# Patient Record
Sex: Female | Born: 1970 | Hispanic: No | Smoking: Never smoker
Health system: Southern US, Community
[De-identification: ages and names within clinical notes are randomized; demographics above are authoritative.]

## PROBLEM LIST (undated history)

## (undated) ENCOUNTER — Inpatient Hospital Stay (HOSPITAL_COMMUNITY): Payer: Self-pay

## (undated) DIAGNOSIS — O039 Complete or unspecified spontaneous abortion without complication: Secondary | ICD-10-CM

## (undated) HISTORY — DX: Complete or unspecified spontaneous abortion without complication: O03.9

## (undated) HISTORY — PX: NO PAST SURGERIES: SHX2092

---

## 1999-03-21 ENCOUNTER — Inpatient Hospital Stay (HOSPITAL_COMMUNITY): Admission: AD | Admit: 1999-03-21 | Discharge: 1999-03-23 | Payer: Self-pay | Admitting: Obstetrics

## 2001-04-17 ENCOUNTER — Ambulatory Visit (HOSPITAL_COMMUNITY): Admission: RE | Admit: 2001-04-17 | Discharge: 2001-04-17 | Payer: Self-pay | Admitting: Family Medicine

## 2001-04-17 ENCOUNTER — Encounter: Payer: Self-pay | Admitting: Family Medicine

## 2002-12-31 ENCOUNTER — Ambulatory Visit (HOSPITAL_COMMUNITY): Admission: RE | Admit: 2002-12-31 | Discharge: 2002-12-31 | Payer: Self-pay | Admitting: Family Medicine

## 2003-10-25 ENCOUNTER — Ambulatory Visit: Payer: Self-pay | Admitting: Family Medicine

## 2003-11-25 ENCOUNTER — Ambulatory Visit: Payer: Self-pay | Admitting: Family Medicine

## 2004-03-06 ENCOUNTER — Ambulatory Visit: Payer: Self-pay | Admitting: Family Medicine

## 2004-03-18 ENCOUNTER — Ambulatory Visit: Payer: Self-pay | Admitting: Cardiology

## 2004-03-18 ENCOUNTER — Encounter: Payer: Self-pay | Admitting: Cardiology

## 2004-03-18 ENCOUNTER — Ambulatory Visit (HOSPITAL_COMMUNITY): Admission: RE | Admit: 2004-03-18 | Discharge: 2004-03-18 | Payer: Self-pay | Admitting: Family Medicine

## 2004-07-03 ENCOUNTER — Ambulatory Visit: Payer: Self-pay | Admitting: Family Medicine

## 2004-07-06 ENCOUNTER — Ambulatory Visit: Payer: Self-pay | Admitting: *Deleted

## 2004-08-04 ENCOUNTER — Ambulatory Visit: Payer: Self-pay | Admitting: Family Medicine

## 2005-05-07 ENCOUNTER — Ambulatory Visit: Payer: Self-pay | Admitting: Family Medicine

## 2005-11-23 ENCOUNTER — Ambulatory Visit: Payer: Self-pay | Admitting: Family Medicine

## 2005-11-23 ENCOUNTER — Encounter (INDEPENDENT_AMBULATORY_CARE_PROVIDER_SITE_OTHER): Payer: Self-pay | Admitting: Family Medicine

## 2005-11-29 ENCOUNTER — Ambulatory Visit (HOSPITAL_COMMUNITY): Admission: RE | Admit: 2005-11-29 | Discharge: 2005-11-29 | Payer: Self-pay | Admitting: Family Medicine

## 2006-11-02 ENCOUNTER — Encounter (INDEPENDENT_AMBULATORY_CARE_PROVIDER_SITE_OTHER): Payer: Self-pay | Admitting: *Deleted

## 2007-07-20 ENCOUNTER — Ambulatory Visit: Payer: Self-pay | Admitting: Internal Medicine

## 2007-07-20 ENCOUNTER — Encounter (INDEPENDENT_AMBULATORY_CARE_PROVIDER_SITE_OTHER): Payer: Self-pay | Admitting: Family Medicine

## 2007-07-20 LAB — CONVERTED CEMR LAB
AST: 12 units/L (ref 0–37)
Alkaline Phosphatase: 47 units/L (ref 39–117)
BUN: 11 mg/dL (ref 6–23)
Basophils Absolute: 0.1 10*3/uL (ref 0.0–0.1)
Basophils Relative: 1 % (ref 0–1)
Calcium: 8.9 mg/dL (ref 8.4–10.5)
Chloride: 106 meq/L (ref 96–112)
Creatinine, Ser: 0.61 mg/dL (ref 0.40–1.20)
Eosinophils Relative: 2 % (ref 0–5)
Glucose, Bld: 67 mg/dL — ABNORMAL LOW (ref 70–99)
HCT: 45 % (ref 36.0–46.0)
Hemoglobin: 14.6 g/dL (ref 12.0–15.0)
MCHC: 32.4 g/dL (ref 30.0–36.0)
Monocytes Absolute: 0.4 10*3/uL (ref 0.1–1.0)
Monocytes Relative: 9 % (ref 3–12)
RDW: 13 % (ref 11.5–15.5)

## 2007-08-14 ENCOUNTER — Ambulatory Visit (HOSPITAL_COMMUNITY): Admission: RE | Admit: 2007-08-14 | Discharge: 2007-08-14 | Payer: Self-pay | Admitting: Family Medicine

## 2007-10-03 ENCOUNTER — Ambulatory Visit: Payer: Self-pay | Admitting: Internal Medicine

## 2007-10-03 ENCOUNTER — Encounter (INDEPENDENT_AMBULATORY_CARE_PROVIDER_SITE_OTHER): Payer: Self-pay | Admitting: *Deleted

## 2007-12-11 ENCOUNTER — Emergency Department (HOSPITAL_COMMUNITY): Admission: EM | Admit: 2007-12-11 | Discharge: 2007-12-11 | Payer: Self-pay | Admitting: Family Medicine

## 2008-01-04 ENCOUNTER — Ambulatory Visit: Payer: Self-pay | Admitting: Internal Medicine

## 2008-01-05 ENCOUNTER — Encounter (INDEPENDENT_AMBULATORY_CARE_PROVIDER_SITE_OTHER): Payer: Self-pay | Admitting: Internal Medicine

## 2008-01-05 LAB — CONVERTED CEMR LAB
Bilirubin Urine: NEGATIVE
Protein, ur: 30 mg/dL — AB
Specific Gravity, Urine: 1.015 (ref 1.005–1.03)
Urobilinogen, UA: 0.2 (ref 0.0–1.0)

## 2009-01-21 ENCOUNTER — Ambulatory Visit: Payer: Self-pay | Admitting: Family Medicine

## 2009-01-21 LAB — CONVERTED CEMR LAB: Chlamydia, DNA Probe: NEGATIVE

## 2010-03-07 ENCOUNTER — Encounter: Payer: Self-pay | Admitting: Family Medicine

## 2010-04-10 ENCOUNTER — Other Ambulatory Visit: Payer: Self-pay | Admitting: Family Medicine

## 2010-04-10 ENCOUNTER — Encounter (INDEPENDENT_AMBULATORY_CARE_PROVIDER_SITE_OTHER): Payer: Self-pay | Admitting: Family Medicine

## 2010-04-10 LAB — CONVERTED CEMR LAB: GC Probe Amp, Genital: NEGATIVE

## 2010-05-13 ENCOUNTER — Other Ambulatory Visit: Payer: Self-pay | Admitting: Family Medicine

## 2010-05-13 ENCOUNTER — Encounter: Payer: Self-pay | Admitting: Family Medicine

## 2010-05-13 DIAGNOSIS — N72 Inflammatory disease of cervix uteri: Secondary | ICD-10-CM

## 2010-05-13 DIAGNOSIS — R87810 Cervical high risk human papillomavirus (HPV) DNA test positive: Secondary | ICD-10-CM

## 2010-05-14 LAB — POCT PREGNANCY, URINE: Preg Test, Ur: NEGATIVE

## 2012-08-17 ENCOUNTER — Emergency Department (INDEPENDENT_AMBULATORY_CARE_PROVIDER_SITE_OTHER): Payer: Self-pay

## 2012-08-17 ENCOUNTER — Emergency Department (INDEPENDENT_AMBULATORY_CARE_PROVIDER_SITE_OTHER)
Admission: EM | Admit: 2012-08-17 | Discharge: 2012-08-17 | Disposition: A | Discharge status: Home / Self Care | Payer: Self-pay | Source: Home / Self Care

## 2012-08-17 ENCOUNTER — Encounter (HOSPITAL_COMMUNITY): Payer: Self-pay | Admitting: Emergency Medicine

## 2012-08-17 DIAGNOSIS — S139XXA Sprain of joints and ligaments of unspecified parts of neck, initial encounter: Secondary | ICD-10-CM

## 2012-08-17 DIAGNOSIS — S335XXA Sprain of ligaments of lumbar spine, initial encounter: Secondary | ICD-10-CM

## 2012-08-17 DIAGNOSIS — S161XXA Strain of muscle, fascia and tendon at neck level, initial encounter: Secondary | ICD-10-CM

## 2012-08-17 DIAGNOSIS — S39012A Strain of muscle, fascia and tendon of lower back, initial encounter: Secondary | ICD-10-CM

## 2012-08-17 MED ORDER — HYDROCODONE-ACETAMINOPHEN 5-325 MG PO TABS: 1.0000 | ORAL_TABLET | Freq: Three times a day (TID) | ORAL | Status: DC | PRN

## 2012-08-17 MED ORDER — METHYLPREDNISOLONE 4 MG PO KIT: PACK | ORAL | Status: DC

## 2012-08-17 MED ORDER — METHOCARBAMOL 500 MG PO TABS: 500.0000 mg | ORAL_TABLET | Freq: Three times a day (TID) | ORAL | Status: DC | PRN
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
## 2012-08-19 NOTE — ED Provider Notes (Signed)
Medical screening examination/treatment/procedure(s) were performed by non-physician practitioner and as supervising physician I was immediately available for consultation/collaboration.   [facility]; MD  Sharin Grave, MD 08/19/12 [phone]

## 2013-06-08 ENCOUNTER — Encounter: Payer: Self-pay | Admitting: Internal Medicine

## 2013-06-08 ENCOUNTER — Ambulatory Visit: Payer: No Typology Code available for payment source | Attending: Internal Medicine | Admitting: Internal Medicine

## 2013-06-08 VITALS — BP 117/77 | HR 66 | Temp 98.5°F | Resp 16 | Wt 119.8 lb

## 2013-06-08 DIAGNOSIS — R1012 Left upper quadrant pain: Secondary | ICD-10-CM | POA: Insufficient documentation

## 2013-06-08 DIAGNOSIS — IMO0001 Reserved for inherently not codable concepts without codable children: Secondary | ICD-10-CM | POA: Insufficient documentation

## 2013-06-08 DIAGNOSIS — Z139 Encounter for screening, unspecified: Secondary | ICD-10-CM

## 2013-06-08 DIAGNOSIS — M791 Myalgia, unspecified site: Secondary | ICD-10-CM

## 2013-06-08 DIAGNOSIS — R350 Frequency of micturition: Secondary | ICD-10-CM

## 2013-06-08 LAB — POCT URINALYSIS DIPSTICK
Bilirubin, UA: NEGATIVE
Glucose, UA: NEGATIVE
KETONES UA: NEGATIVE
Leukocytes, UA: NEGATIVE
Nitrite, UA: NEGATIVE
PH UA: 7
PROTEIN UA: NEGATIVE
SPEC GRAV UA: 1.01
Urobilinogen, UA: 0.2

## 2013-06-08 LAB — CBC WITH DIFFERENTIAL/PLATELET
BASOS ABS: 0 10*3/uL (ref 0.0–0.1)
BASOS PCT: 1 % (ref 0–1)
EOS PCT: 2 % (ref 0–5)
Eosinophils Absolute: 0.1 10*3/uL (ref 0.0–0.7)
HEMATOCRIT: 39.6 % (ref 36.0–46.0)
Hemoglobin: 13.9 g/dL (ref 12.0–15.0)
LYMPHS PCT: 30 % (ref 12–46)
Lymphs Abs: 1.3 10*3/uL (ref 0.7–4.0)
MCH: 31.9 pg (ref 26.0–34.0)
MCHC: 35.1 g/dL (ref 30.0–36.0)
MCV: 90.8 fL (ref 78.0–100.0)
MONO ABS: 0.3 10*3/uL (ref 0.1–1.0)
Monocytes Relative: 8 % (ref 3–12)
Neutro Abs: 2.5 10*3/uL (ref 1.7–7.7)
Neutrophils Relative %: 59 % (ref 43–77)
Platelets: 265 10*3/uL (ref 150–400)
RBC: 4.36 MIL/uL (ref 3.87–5.11)
RDW: 13.4 % (ref 11.5–15.5)
WBC: 4.2 10*3/uL (ref 4.0–10.5)

## 2013-06-08 LAB — COMPLETE METABOLIC PANEL WITH GFR
ALT: 9 U/L (ref 0–35)
AST: 12 U/L (ref 0–37)
Albumin: 4.3 g/dL (ref 3.5–5.2)
Alkaline Phosphatase: 53 U/L (ref 39–117)
BUN: 12 mg/dL (ref 6–23)
CALCIUM: 9.3 mg/dL (ref 8.4–10.5)
CHLORIDE: 105 meq/L (ref 96–112)
CO2: 26 mEq/L (ref 19–32)
CREATININE: 0.57 mg/dL (ref 0.50–1.10)
GFR, Est African American: >89 mL/min
GFR, Est Non African American: >89 mL/min
Glucose, Bld: 79 mg/dL (ref 70–99)
Potassium: 3.9 mEq/L (ref 3.5–5.3)
Sodium: 140 mEq/L (ref 135–145)
Total Bilirubin: 0.5 mg/dL (ref 0.2–1.2)
Total Protein: 6.9 g/dL (ref 6.0–8.3)

## 2013-06-08 LAB — LIPID PANEL
CHOL/HDL RATIO: 2.6 ratio
CHOLESTEROL: 180 mg/dL (ref 0–200)
HDL: 70 mg/dL (ref >39)
LDL Cholesterol: 97 mg/dL (ref 0–99)
Triglycerides: 63 mg/dL (ref <150)
VLDL: 13 mg/dL (ref 0–40)

## 2013-06-08 NOTE — Progress Notes (Signed)
Patient Demographics  Suzanne Rivas, is a 43 y.o. female  ZOX:[id number]:[id number]  [id number]:[id number]  DOB - 04/10/1970  CC:  Chief Complaint  Patient presents with  . Establish Care       HPI: Suzanne Rivas is a 43 y.o. female here today to establish medical care. Patient reported to have chronic upper abdominal left-sided pain which is sharp and last for a few hours, patient denies any relationship with food denies any change in bowel habit, she reported to have lot of urinary frequency denies any dysuria denies any nausea vomiting, she had a Pap smear done last year and is currently having menstrual periods. Patient reported myalgia and feeling fatigued  when she has menstrual periods, denies any menorrhagia. Patient has No headache, No chest pain, No abdominal pain - No Nausea, No new weakness tingling or numbness, No Cough - SOB.  No Known Allergies History reviewed. No pertinent past medical history. Current Outpatient Prescriptions on File Prior to Visit  Medication Sig Dispense Refill  . HYDROcodone-acetaminophen (NORCO/VICODIN) 5-325 MG per tablet Take 1-2 tablets by mouth every 8 (eight) hours as needed for pain.  30 tablet  0  . methocarbamol (ROBAXIN) 500 MG tablet Take 1 tablet (500 mg total) by mouth every 8 (eight) hours as needed (spasms).  30 tablet  0  . methylPREDNISolone (MEDROL DOSEPAK) 4 MG tablet 6 day dose pack.  Take as directed.  21 tablet  0   No current facility-administered medications on file prior to visit.   Family History  Problem Relation Age of Onset  . Cancer Maternal Grandmother    History   Social History  . Marital Status: Single    Spouse Name: N/A    Number of Children: N/A  . Years of Education: N/A   Occupational History  . Not on file.   Social History Main Topics  . Smoking status: Never Smoker   . Smokeless tobacco: Not on file  . Alcohol Use: No     Comment: occasinally   . Drug Use: No  . Sexual Activity: Not on  file   Other Topics Concern  . Not on file   Social History Narrative  . No narrative on file    Review of Systems: Constitutional: Negative for fever, chills, diaphoresis, activity change, appetite change and fatigue. HENT: Negative for ear pain, nosebleeds, congestion, facial swelling, rhinorrhea, neck pain, neck stiffness and ear discharge.  Eyes: Negative for pain, discharge, redness, itching and visual disturbance. Respiratory: Negative for cough, choking, chest tightness, shortness of breath, wheezing and stridor.  Cardiovascular: Negative for chest pain, palpitations and leg swelling. Gastrointestinal: Negative for abdominal distention. Genitourinary: Negative for dysuria, urgency, frequency, hematuria, flank pain, decreased urine volume, difficulty urinating and dyspareunia.  Musculoskeletal: Negative for back pain, joint swelling, arthralgia and gait problem. Neurological: Negative for dizziness, tremors, seizures, syncope, facial asymmetry, speech difficulty, weakness, light-headedness, numbness and headaches.  Hematological: Negative for adenopathy. Does not bruise/bleed easily. Psychiatric/Behavioral: Negative for hallucinations, behavioral problems, confusion, dysphoric mood, decreased concentration and agitation.    Objective:   Filed Vitals:   06/08/13 0914  BP: 117/77  Pulse: 66  Temp: 98.5 F (36.9 C)  Resp: 16    Physical Exam: Constitutional: Patient appears well-developed and well-nourished. No distress. HENT: Normocephalic, atraumatic, External right and left ear normal. Oropharynx is clear and moist.  Eyes: Conjunctivae and EOM are normal. PERRLA, no scleral icterus. Neck: Normal ROM. Neck supple. No JVD. No tracheal deviation. No  thyromegaly. CVS: RRR, S1/S2 +, no murmurs, no gallops, no carotid bruit.  Pulmonary: Effort and breath sounds normal, no stridor, rhonchi, wheezes, rales.  Abdominal: Soft. BS +, no distension, tenderness, rebound or guarding.  No CVA tenderness. Musculoskeletal: Normal range of motion. No edema and no tenderness. SLR test negative  Neuro: Alert. Normal reflexes, muscle tone coordination. No cranial nerve deficit. Skin: Skin is warm and dry. No rash noted. Not diaphoretic. No erythema. No pallor. Psychiatric: Normal mood and affect. Behavior, judgment, thought content normal.  Lab Results  Component Value Date   WBC 4.9 07/20/2007   HGB 14.6 07/20/2007   HCT 45.0 07/20/2007   MCV 100.0 07/20/2007   PLT 215 07/20/2007   Lab Results  Component Value Date   CREATININE 0.61 07/20/2007   BUN 11 07/20/2007   NA 140 07/20/2007   K 3.9 07/20/2007   CL 106 07/20/2007   CO2 23 07/20/2007    No results found for this basename: HGBA1C   Lipid Panel  No results found for this basename: chol, trig, hdl, cholhdl, vldl, ldlcalc       Assessment and plan:   1. Frequent urination  - Urinalysis Dipstick Results for orders placed in visit on 06/08/13  POCT URINALYSIS DIPSTICK      Result Value Ref Range   Color, UA yellow     Clarity, UA clear     Glucose, UA neg     Bilirubin, UA neg     Ketones, UA neg     Spec Grav, UA 1.010     Blood, UA large     pH, UA 7.0     Protein, UA neg     Urobilinogen, UA 0.2     Nitrite, UA neg     Leukocytes, UA Negative     Urine negative for infection, blood most likely menstrual periods.   2. Abdominal pain, left upper quadrant  - US Abdomen Complete; Future  3. Myalgia  - CBC with Differential - TSH  4. Screening  ordered baseline blood work. - CBC with Differential - COMPLETE METABOLIC PANEL WITH GFR - Lipid panel - Vit D  25 hydroxy (rtn osteoporosis monitoring) - MM DIGITAL SCREENING BILATERAL; Future - Ambulatory referral to Gynecology  Health Maintenance  -Pap Smear: referred to GYN -Mammogram:ordered    Return in about 3 months (around 09/07/2013) for abdominal pain  .   Doris Cheadleeepak Remy Dia, MD

## 2013-06-08 NOTE — Progress Notes (Signed)
Patient here with interpreter Here to establish care Takes no prescribed medication Complains of frequent urination and upper abd pain

## 2013-06-09 LAB — TSH: TSH: 2.09 u[IU]/mL (ref 0.350–4.500)

## 2013-06-09 LAB — VITAMIN D 25 HYDROXY (VIT D DEFICIENCY, FRACTURES): Vit D, 25-Hydroxy: 23 ng/mL — ABNORMAL LOW (ref 30–89)

## 2013-06-18 ENCOUNTER — Telehealth: Payer: Self-pay

## 2013-06-18 MED ORDER — VITAMIN D (ERGOCALCIFEROL) 1.25 MG (50000 UNIT) PO CAPS: 50000.0000 [IU] | ORAL_CAPSULE | ORAL | Status: DC

## 2013-06-18 NOTE — Telephone Encounter (Signed)
Interpreter line used Patient is aware of her lab results Prescription sent to [facility]

## 2013-06-18 NOTE — Telephone Encounter (Signed)
Message copied by Lestine MountJUAREZ, Carney Saxton L on Mon Jun 18, 2013  4:40 PM ------      Message from: Doris CheadleADVANI, DEEPAK      Created: Mon Jun 18, 2013 12:03 PM       Blood work reviewed, noticed low vitamin D, call patient advise to start ergocalciferol 50,000 units once a week for the duration of  12 weeks.       ------

## 2013-06-28 ENCOUNTER — Other Ambulatory Visit (HOSPITAL_COMMUNITY)
Admission: RE | Admit: 2013-06-28 | Discharge: 2013-06-28 | Disposition: A | Discharge status: Home / Self Care | Payer: No Typology Code available for payment source | Source: Ambulatory Visit | Attending: Family Medicine | Admitting: Family Medicine

## 2013-06-28 ENCOUNTER — Ambulatory Visit (INDEPENDENT_AMBULATORY_CARE_PROVIDER_SITE_OTHER): Payer: No Typology Code available for payment source | Admitting: Family Medicine

## 2013-06-28 VITALS — BP 121/79 | HR 69 | Temp 98.0°F | Wt 118.5 lb

## 2013-06-28 DIAGNOSIS — Z1151 Encounter for screening for human papillomavirus (HPV): Secondary | ICD-10-CM | POA: Insufficient documentation

## 2013-06-28 DIAGNOSIS — Z124 Encounter for screening for malignant neoplasm of cervix: Secondary | ICD-10-CM

## 2013-06-28 DIAGNOSIS — Z01419 Encounter for gynecological examination (general) (routine) without abnormal findings: Secondary | ICD-10-CM | POA: Insufficient documentation

## 2013-07-03 NOTE — Progress Notes (Signed)
Patient ID: Suzanne Rivas, female   DOB: 11/13/1970, 43 y.o.   MRN: [id number] Visit for pap smear/ well woman GYN.  Patient   is   having periods They are   regular She   is  Currently sexually active with men Contraceptive needs: None Pelvic issues such as pain, discharge, abnormal bleeding? No Smoker?   Never smoker Any pertinent FH of cervical or uterine cancer, breat cancer? No Any particular pertinent personal medical history?  no   OBJECTIVE: Well Developed female no acute distress GU: Externally normal. No adnexal masses or tenderness. Uterus is normal in position. No vaginal discharge. Pap is obtained.

## 2013-07-05 ENCOUNTER — Ambulatory Visit (HOSPITAL_COMMUNITY)
Admission: RE | Admit: 2013-07-05 | Discharge: 2013-07-05 | Disposition: A | Discharge status: Home / Self Care | Payer: No Typology Code available for payment source | Source: Ambulatory Visit | Attending: Internal Medicine | Admitting: Internal Medicine

## 2013-07-05 ENCOUNTER — Other Ambulatory Visit: Payer: Self-pay | Admitting: Internal Medicine

## 2013-07-05 DIAGNOSIS — Z139 Encounter for screening, unspecified: Secondary | ICD-10-CM

## 2013-07-05 DIAGNOSIS — Z1231 Encounter for screening mammogram for malignant neoplasm of breast: Secondary | ICD-10-CM

## 2013-07-10 ENCOUNTER — Ambulatory Visit (HOSPITAL_COMMUNITY): Admission: RE | Admit: 2013-07-10 | Payer: No Typology Code available for payment source | Source: Ambulatory Visit

## 2013-07-17 ENCOUNTER — Encounter: Payer: Self-pay | Admitting: Family Medicine

## 2013-09-07 ENCOUNTER — Encounter: Payer: Self-pay | Admitting: Internal Medicine

## 2013-09-07 ENCOUNTER — Ambulatory Visit: Payer: Self-pay | Attending: Internal Medicine | Admitting: Internal Medicine

## 2013-09-07 VITALS — BP 123/72 | HR 61 | Temp 98.5°F | Resp 16 | Wt 118.6 lb

## 2013-09-07 DIAGNOSIS — Z79899 Other long term (current) drug therapy: Secondary | ICD-10-CM | POA: Insufficient documentation

## 2013-09-07 DIAGNOSIS — E559 Vitamin D deficiency, unspecified: Secondary | ICD-10-CM | POA: Insufficient documentation

## 2013-09-07 DIAGNOSIS — R1013 Epigastric pain: Secondary | ICD-10-CM

## 2013-09-07 DIAGNOSIS — R1012 Left upper quadrant pain: Secondary | ICD-10-CM | POA: Insufficient documentation

## 2013-09-07 LAB — POCT URINALYSIS DIPSTICK
BILIRUBIN UA: NEGATIVE
Blood, UA: NEGATIVE
GLUCOSE UA: NEGATIVE
KETONES UA: NEGATIVE
LEUKOCYTES UA: NEGATIVE
Nitrite, UA: NEGATIVE
PH UA: 7
Protein, UA: NEGATIVE
Spec Grav, UA: 1.02
Urobilinogen, UA: 1

## 2013-09-07 LAB — POCT URINE PREGNANCY: PREG TEST UR: NEGATIVE

## 2013-09-07 NOTE — Progress Notes (Signed)
Patient here for follow up on her abd pain States still having pain in her upper quadrant on th left side

## 2013-09-07 NOTE — Progress Notes (Signed)
MRN: [id number] Name: Suzanne Rivas  Sex: female Age: 43 y.o. DOB: 06/04/1970  Allergies: Review of patient's allergies indicates no known allergies.  Chief Complaint  Patient presents with  . Abdominal Pain    HPI: Patient is 43 y.o. female who comes today for followup, still complaining of upper abdominal pain especially on the left upper quadrant denies any nausea vomiting change in bowel habits denies any urinary symptoms denies any relationship with food, patient had a blood work done which was reviewed with the patient noticed vitamin D deficiency which she already completed a course of prescription dose. Ultrasound was ordered on the last visit but patient has not been able to get done.  History reviewed. No pertinent past medical history.  History reviewed. No pertinent past surgical history.    Medication List       This list is accurate as of: 09/07/13  5:26 PM.  Always use your most recent med list.               HYDROcodone-acetaminophen 5-325 MG per tablet  Commonly known as:  NORCO/VICODIN  Take 1-2 tablets by mouth every 8 (eight) hours as needed for pain.     methocarbamol 500 MG tablet  Commonly known as:  ROBAXIN  Take 1 tablet (500 mg total) by mouth every 8 (eight) hours as needed (spasms).     methylPREDNISolone 4 MG tablet  Commonly known as:  MEDROL DOSEPAK  6 day dose pack.  Take as directed.     Vitamin D (Ergocalciferol) 50000 UNITS Caps capsule  Commonly known as:  DRISDOL  Take 1 capsule (50,000 Units total) by mouth every 7 (seven) days.        No orders of the defined types were placed in this encounter.     There is no immunization history on file for this patient.  Family History  Problem Relation Age of Onset  . Cancer Maternal Grandmother     History  Substance Use Topics  . Smoking status: Never Smoker   . Smokeless tobacco: Not on file  . Alcohol Use: No     Comment: occasinally     Review of  Systems   As noted in HPI  Filed Vitals:   09/07/13 1705  BP: 123/72  Pulse: 61  Temp: 98.5 F (36.9 C)  Resp: 16    Physical Exam  Physical Exam  Constitutional: No distress.  Eyes: EOM are normal. Pupils are equal, round, and reactive to light.  Cardiovascular: Normal rate and regular rhythm.   Pulmonary/Chest: Breath sounds normal. No respiratory distress. She has no wheezes. She has no rales.  Abdominal: Bowel sounds are normal. There is no rebound and no guarding.  Minimal discomfort with deep palpation in LUQ   Musculoskeletal: She exhibits no edema.    CBC    Component Value Date/Time   WBC 4.2 06/08/2013 0947   RBC 4.36 06/08/2013 0947   HGB 13.9 06/08/2013 0947   HCT 39.6 06/08/2013 0947   PLT 265 06/08/2013 0947   MCV 90.8 06/08/2013 0947   LYMPHSABS 1.3 06/08/2013 0947   MONOABS 0.3 06/08/2013 0947   EOSABS 0.1 06/08/2013 0947   BASOSABS 0.0 06/08/2013 0947    CMP     Component Value Date/Time   NA 140 06/08/2013 0947   K 3.9 06/08/2013 0947   CL 105 06/08/2013 0947   CO2 26 06/08/2013 0947   GLUCOSE 79 06/08/2013 0947   BUN 12 06/08/2013 0947  CREATININE 0.57 06/08/2013 0947   CREATININE 0.61 07/20/2007 2322   CALCIUM 9.3 06/08/2013 0947   PROT 6.9 06/08/2013 0947   ALBUMIN 4.3 06/08/2013 0947   AST 12 06/08/2013 0947   ALT 9 06/08/2013 0947   ALKPHOS 53 06/08/2013 0947   BILITOT 0.5 06/08/2013 0947   GFRNONAA >89 06/08/2013 0947   GFRAA >89 06/08/2013 0947    Lab Results  Component Value Date/Time   CHOL 180 06/08/2013  9:47 AM    No components found with this basename: hga1c    Lab Results  Component Value Date/Time   AST 12 06/08/2013  9:47 AM    Assessment and Plan  Abdominal pain, epigastric - Plan:  Results for orders placed in visit on 09/07/13  POCT URINALYSIS DIPSTICK      Result Value Ref Range   Color, UA yellow     Clarity, UA cloudy     Glucose, UA neg     Bilirubin, UA neg     Ketones, UA neg     Spec Grav, UA 1.020     Blood, UA  neg     pH, UA 7.0     Protein, UA neg     Urobilinogen, UA 1.0     Nitrite, UA neg     Leukocytes, UA Negative    POCT URINE PREGNANCY      Result Value Ref Range   Preg Test, Ur Negative     Urine test is negative for infection.    I have ordered her US Abdomen Complete for further evaluation.  Unspecified vitamin D deficiency Patient will take over-the-counter vitamin D 2000 units daily.  Health Maintenance  -Pap Smear: uptodate  -Mammogram: uptodate   Return in about 6 months (around 03/10/2014), or if symptoms worsen or fail to improve, for abdominal pain .  Doris Cheadle, MD

## 2013-09-10 ENCOUNTER — Telehealth: Payer: Self-pay

## 2013-09-10 NOTE — Telephone Encounter (Signed)
Called patient to let her know her US is scheduled for this wed 09/12/13 At 9 pm

## 2013-09-12 ENCOUNTER — Ambulatory Visit (HOSPITAL_COMMUNITY): Payer: Self-pay

## 2013-09-18 ENCOUNTER — Telehealth: Payer: Self-pay | Admitting: Internal Medicine

## 2013-09-18 ENCOUNTER — Ambulatory Visit (HOSPITAL_COMMUNITY)
Admission: RE | Admit: 2013-09-18 | Discharge: 2013-09-18 | Disposition: A | Discharge status: Home / Self Care | Payer: Self-pay | Source: Ambulatory Visit | Attending: Internal Medicine | Admitting: Internal Medicine

## 2013-09-18 DIAGNOSIS — R1013 Epigastric pain: Secondary | ICD-10-CM | POA: Insufficient documentation

## 2013-09-18 NOTE — Telephone Encounter (Signed)
Pt needs prescription for methylPREDNISolone 4 mg. [facility] [address] Please f/u with Pt

## 2013-10-01 ENCOUNTER — Telehealth: Payer: Self-pay

## 2013-10-01 NOTE — Telephone Encounter (Signed)
Interpreter line used Patient  Not available Message left to return our call

## 2013-10-01 NOTE — Telephone Encounter (Signed)
Message copied by Lestine MountJUAREZ, Hadley Soileau L on Mon Oct 01, 2013  2:04 PM ------      Message from: Doris CheadleADVANI, DEEPAK      Created: Tue Sep 18, 2013  3:46 PM       Call and let the patient know that her abdominal ultrasound is negative for gallstones. ------

## 2013-10-08 ENCOUNTER — Telehealth: Payer: Self-pay | Admitting: Emergency Medicine

## 2013-10-08 NOTE — Telephone Encounter (Signed)
Left message on voicemail for pt to call clinic for ultrasound results per Spanish intepretor, Orion

## 2013-10-08 NOTE — Telephone Encounter (Signed)
Pt. Calling to get results of ultrasound. Please f/u with pt.

## 2013-10-09 ENCOUNTER — Telehealth: Payer: Self-pay | Admitting: *Deleted

## 2013-10-09 NOTE — Telephone Encounter (Signed)
Pt is aware of her US results.  

## 2013-10-09 NOTE — Telephone Encounter (Signed)
Message copied by Raynelle Chary on Tue Oct 09, 2013  4:12 PM ------      Message from: Doris Cheadle      Created: Tue Sep 18, 2013  3:46 PM       Call and let the patient know that her abdominal ultrasound is negative for gallstones. ------

## 2014-11-20 ENCOUNTER — Ambulatory Visit: Payer: Self-pay | Admitting: Internal Medicine

## 2014-12-06 ENCOUNTER — Ambulatory Visit: Payer: Self-pay | Admitting: Internal Medicine

## 2014-12-24 ENCOUNTER — Ambulatory Visit (INDEPENDENT_AMBULATORY_CARE_PROVIDER_SITE_OTHER): Payer: Self-pay | Admitting: Internal Medicine

## 2014-12-24 ENCOUNTER — Encounter: Payer: Self-pay | Admitting: Internal Medicine

## 2014-12-24 VITALS — BP 116/74 | HR 66 | Ht 63.0 in | Wt 118.0 lb

## 2014-12-24 DIAGNOSIS — M722 Plantar fascial fibromatosis: Secondary | ICD-10-CM

## 2014-12-24 MED ORDER — IBUPROFEN 600 MG PO TABS: ORAL_TABLET | ORAL | Status: DC

## 2014-12-24 NOTE — Patient Instructions (Signed)
Stretches of foot before getting out of bed and in the evening every day Stretches with frozen water bottle when sitting during day Heel cups for both shoes--make sure you use them in all shoes No barefoot time--always wear cushioned shoes Ibuprofen twice daily for at least 2 weeks.  Always take with food.

## 2014-12-24 NOTE — Progress Notes (Signed)
   Subjective:    Patient ID: Suzanne Rivas, female    DOB: 04/01/1970, 44 y.o.   MRN: [id number]  HPI  Right heel pain for 4-5 months.  Cannot recall an injury prior to pain starting up.  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  On hard floors all day.  Wears slip resistant shoes.  States the shoes are cushioned.  Never barefoot.  Has carpeting at home.  Hurts constantly.  Maybe hurts a bit more after having not been on it for a while.  Not clear if first thing in morning it is particularly bad.   Takes Tylenol 500 mg tab with some improvement.  Has tried ice and massage to the area as well.  Icy Hot has been tried as well.  None of these things seems to help.    Review of Systems     Objective:   Physical Exam  Right Foot:  Good cap refill.  Normodynamic DP and PT pulses. Full ROM--some tenderness with dorsiflexion.   No erythema or swelling.  Significant point tenderness over plantar aspect of calcaneus.  Mild tenderness in medial arch.      Assessment & Plan:  Right Plantar Fasciitis:  Heel cups, stretches, Ibuprofen.  Avoid going bare or stocking footed. Follow up in 2 months if no improvement.

## 2015-05-17 DIAGNOSIS — O039 Complete or unspecified spontaneous abortion without complication: Secondary | ICD-10-CM

## 2015-05-17 HISTORY — DX: Complete or unspecified spontaneous abortion without complication: O03.9

## 2015-06-20 ENCOUNTER — Ambulatory Visit (INDEPENDENT_AMBULATORY_CARE_PROVIDER_SITE_OTHER): Payer: Self-pay | Admitting: Internal Medicine

## 2015-06-20 ENCOUNTER — Encounter: Payer: Self-pay | Admitting: Internal Medicine

## 2015-06-20 VITALS — BP 118/76 | HR 70 | Resp 16 | Ht 63.0 in | Wt 118.0 lb

## 2015-06-20 DIAGNOSIS — R102 Pelvic and perineal pain: Secondary | ICD-10-CM

## 2015-06-20 DIAGNOSIS — N926 Irregular menstruation, unspecified: Secondary | ICD-10-CM

## 2015-06-20 LAB — POCT WET PREP WITH KOH
CLUE CELLS WET PREP PER HPF POC: NEGATIVE
KOH PREP POC: NEGATIVE
RBC Wet Prep HPF POC: NEGATIVE
TRICHOMONAS UA: NEGATIVE
YEAST WET PREP PER HPF POC: NEGATIVE

## 2015-06-20 LAB — POCT URINE PREGNANCY: PREG TEST UR: NEGATIVE

## 2015-06-20 LAB — POCT URINALYSIS DIPSTICK
BILIRUBIN UA: NEGATIVE
GLUCOSE UA: NEGATIVE
Ketones, UA: 5
LEUKOCYTES UA: NEGATIVE
NITRITE UA: NEGATIVE
Protein, UA: NEGATIVE
Spec Grav, UA: 1.02
Urobilinogen, UA: NEGATIVE
pH, UA: 5

## 2015-06-20 NOTE — Progress Notes (Signed)
Subjective:    Patient ID: Suzanne Rivas, female    DOB: 11/19/1970, 45 y.o.   MRN: [id number]  HPI   1.  ProblemNoreene Larssons with period. March 18-22nd:  Had regular period April 8th and 9th:   Had just a little bloody show. May 3,4.5:  Having just a little bit of bloody show When has had the minimal menstrual flow, having a lot of pain in the mid pelvic area.  Did have some right pelvic pain about 4 days ago lasting about 3 minutes and then gone.  Hurts more there when sits down. Otherwise, no pain in between blood flow.   Is sexually active.  Not using anything to prevent pregnancy.  Has not had intercourse in 1 month as her fiance has been out of time.   Is with her fiance/boyfriend of 10 years and they have never used any protection.   No vaginal discharge. No definite breast tenderness.  No definite fatigue, no nausea. Patient has been pregnant in past with a different partner.  She has two older children, ages 6616 and 43 yo.   Current outpatient prescriptions:  Marland Kitchen.  Vitamin D, Ergocalciferol, (DRISDOL) 50000 UNITS CAPS capsule, Take 1 capsule (50,000 Units total) by mouth every 7 (seven) days., Disp: 12 capsule, Rfl: 0 .  ibuprofen (ADVIL,MOTRIN) 600 MG tablet, 1 tab by mouth twice daily with food for next 2 weeks, then every 6 hours as needed thereafter. (Patient not taking: Reported on 06/20/2015), Disp: 60 tablet, Rfl: 1   No Known Allergies   No past medical history on file.   No past surgical history on file.   Family History  Problem Relation Age of Onset  . Cancer Maternal Grandmother     cervical cancer   Social History   Social History  . Marital Status: Single    Spouse Name: N/A  . Number of Children: 2  . Years of Education: N/A   Occupational History  . Cook     [employer]   Social History Main Topics  . Smoking status: Never Smoker   . Smokeless tobacco: Never Used  . Alcohol Use: No     Comment: occasinally   . Drug Use: No  . Sexual Activity: Yes    Other Topics Concern  . Not on file   Social History Narrative   [place]   [place]   Lives at home with her 316 yo daughter.        Review of Systems     Objective:   Physical Exam NAD Lungs:  CTA CV: RRR without murmur or rub, radial pulses normal and equal Abd: S, + BS, No HSM or mass, Tender in lower abdomen, R> L GU:  No discharge, no inflammation, Uterus is anteverted with difficulty intially obtaining good view of cervix Possible small endocervical polyp vs small clot at os--unable to wipe away, but does not extend out of os into vaginal canal.  No CMT, uterus is without mass, but tender with palpation--pt. States really hurts to right adnexa with palpation of uterus Right adnexa tender and full. Left adnexa without mass or tenderness No other vaginal or cervical mucosa lesion.    Assessment & Plan:  1.  Right adnexal tenderness and possible enlargement:  Pelvic ultrasound.  If cannot get in next month, [facility] and can apply for financial assistance thereafter.    2.  ?  Endocervical Polyp?  Will reevaluate  when not menstruating.  Do not think this is related to pain, but could be bleeding from a polyp.  See what ultrasound shows.  Ibuprofen 400-800 mg every 6 hours with food for pain.

## 2015-06-20 NOTE — Patient Instructions (Signed)
Ibuprofen (Motrin, Advil)  200 mg 2-4 tabs with food every 6 hours as needed for pain

## 2015-09-02 ENCOUNTER — Encounter (INDEPENDENT_AMBULATORY_CARE_PROVIDER_SITE_OTHER): Payer: Self-pay

## 2016-03-16 ENCOUNTER — Encounter: Payer: Self-pay | Admitting: Pediatric Intensive Care

## 2016-03-25 ENCOUNTER — Encounter: Payer: Self-pay | Admitting: Pediatric Intensive Care

## 2016-03-25 NOTE — Congregational Nurse Program (Signed)
Congregational Nurse Program Note  Date of Encounter: 03/16/2016  Past Medical History: No past medical history on file.  Encounter Details:     CNP Questionnaire - 03/16/16 1500      Patient Demographics   Is this a new or existing patient? New   Patient is considered a/an Immigrant   Race Latino/Hispanic     Patient Assistance   Location of Patient Assistance [place]   Patient's financial/insurance status Self-Pay (Uninsured)   Uninsured Patient ([employer] Research officer, trade unionCard/Care Connects) Yes   Interventions Not Applicable   Patient referred to apply for the following financial assistance [employer]   Food insecurities addressed Not Technical brewerApplicable   Transportation assistance No   Assistance securing medications No   Educational health offerings Acute disease;Navigating the healthcare system     Encounter Details   Primary purpose of visit Acute Illness/Condition Visit   Was an Emergency Department visit averted? Not Applicable   Does patient have a medical provider? Yes   Patient referred to Follow up with established PCP   Was a mental health screening completed? (GAINS tool) No   Does patient have dental issues? No   Does patient have vision issues? No   Does your patient have an abnormal blood pressure today? No   Since previous encounter, have you referred patient for abnormal blood pressure that resulted in a new diagnosis or medication change? No   Does your patient have an abnormal blood glucose today? No   Since previous encounter, have you referred patient for abnormal blood glucose that resulted in a new diagnosis or medication change? No   Was there a life-saving intervention made? No     Client reports via interpreter Bridget Hartshornerla that she has had sore throat, chills and body aches starting earlier in the week. Now has developed a cough. She is taking OTC cold medication. BBS clear with intermittent coughing. Non-productive. CN advised rest, fluids and see PCP if  symptoms persist.

## 2016-05-11 ENCOUNTER — Encounter (HOSPITAL_COMMUNITY): Payer: Self-pay | Admitting: Medical

## 2016-05-11 ENCOUNTER — Inpatient Hospital Stay (HOSPITAL_COMMUNITY)
Admission: AD | Admit: 2016-05-11 | Discharge: 2016-05-11 | Disposition: A | Discharge status: Home / Self Care | Payer: Self-pay | Source: Ambulatory Visit | Attending: Family Medicine | Admitting: Family Medicine

## 2016-05-11 ENCOUNTER — Ambulatory Visit (INDEPENDENT_AMBULATORY_CARE_PROVIDER_SITE_OTHER): Payer: Self-pay | Admitting: Internal Medicine

## 2016-05-11 ENCOUNTER — Inpatient Hospital Stay (HOSPITAL_COMMUNITY): Payer: Self-pay

## 2016-05-11 ENCOUNTER — Encounter: Payer: Self-pay | Admitting: Internal Medicine

## 2016-05-11 VITALS — BP 112/70 | HR 68 | Resp 12 | Ht 63.0 in | Wt 112.0 lb

## 2016-05-11 DIAGNOSIS — Z3A01 Less than 8 weeks gestation of pregnancy: Secondary | ICD-10-CM

## 2016-05-11 DIAGNOSIS — R109 Unspecified abdominal pain: Secondary | ICD-10-CM

## 2016-05-11 DIAGNOSIS — R1012 Left upper quadrant pain: Secondary | ICD-10-CM | POA: Insufficient documentation

## 2016-05-11 DIAGNOSIS — O26891 Other specified pregnancy related conditions, first trimester: Secondary | ICD-10-CM

## 2016-05-11 DIAGNOSIS — O3680X0 Pregnancy with inconclusive fetal viability, not applicable or unspecified: Secondary | ICD-10-CM

## 2016-05-11 DIAGNOSIS — O26899 Other specified pregnancy related conditions, unspecified trimester: Secondary | ICD-10-CM

## 2016-05-11 DIAGNOSIS — R102 Pelvic and perineal pain: Secondary | ICD-10-CM

## 2016-05-11 DIAGNOSIS — O209 Hemorrhage in early pregnancy, unspecified: Secondary | ICD-10-CM

## 2016-05-11 DIAGNOSIS — Z6790 Unspecified blood type, Rh positive: Secondary | ICD-10-CM | POA: Insufficient documentation

## 2016-05-11 DIAGNOSIS — N926 Irregular menstruation, unspecified: Secondary | ICD-10-CM

## 2016-05-11 LAB — CBC WITH DIFFERENTIAL/PLATELET
BASOS PCT: 1 %
Basophils Absolute: 0.1 10*3/uL (ref 0.0–0.1)
EOS PCT: 2 %
Eosinophils Absolute: 0.1 10*3/uL (ref 0.0–0.7)
HEMATOCRIT: 39.1 % (ref 36.0–46.0)
Hemoglobin: 13.5 g/dL (ref 12.0–15.0)
Lymphocytes Relative: 21 %
Lymphs Abs: 1.5 10*3/uL (ref 0.7–4.0)
MCH: 32.9 pg (ref 26.0–34.0)
MCHC: 34.5 g/dL (ref 30.0–36.0)
MCV: 95.4 fL (ref 78.0–100.0)
MONO ABS: 0.4 10*3/uL (ref 0.1–1.0)
MONOS PCT: 6 %
NEUTROS ABS: 5 10*3/uL (ref 1.7–7.7)
Neutrophils Relative %: 70 %
Platelets: 262 10*3/uL (ref 150–400)
RBC: 4.1 MIL/uL (ref 3.87–5.11)
RDW: 13 % (ref 11.5–15.5)
WBC: 7.1 10*3/uL (ref 4.0–10.5)

## 2016-05-11 LAB — WET PREP, GENITAL
Sperm: NONE SEEN
TRICH WET PREP: NONE SEEN
Yeast Wet Prep HPF POC: NONE SEEN

## 2016-05-11 LAB — HCG, QUANTITATIVE, PREGNANCY: hCG, Beta Chain, Quant, S: 23685 m[IU]/mL — ABNORMAL HIGH (ref <5)

## 2016-05-11 LAB — POCT URINE PREGNANCY: Preg Test, Ur: POSITIVE — AB

## 2016-05-11 LAB — ABO/RH: ABO/RH(D): O POS

## 2016-05-11 NOTE — Progress Notes (Signed)
   Subjective:    Patient ID: Suzanne Rivas, female    DOB: 10/25/1970, 46 y.o.   MRN: [id number]  HPI   Here as had not had period, so checked home pregnancy test 5 days ago, which was positive.  Has had cramping discomfort in her suprapubic area for 2 weeks.  The discomfort is there most of the time.   Started with vaginal bleeding 4 days ago.  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  Today, bleeding as well, but decreased from yesterday.  Not working today.    G3P2 female.  LMP was 03/23/2016.  Has not used anything for birth control.  Has been with her partner for 13 years.  They have been trying to get pregnant but no pregnancy until now.  They have never used any form of birth control.   No history of PID or STD, no history of pelvic or abdominal surgery.  She is about [redacted] weeks gestation based on dates.   She has had no breast tenderness with this pregnancy and states she did have breast tenderness with previous pregnancies.   Her youngest child, a daughter, is 44 yo.  No outpatient prescriptions have been marked as taking for the 05/11/16 encounter (Office Visit) with Julieanne MansonElizabeth Antawn Sison, MD.    No Known Allergies        Review of Systems     Objective:   Physical Exam NAD Abd:  S, tender from umbilicus to suprapubic area.  Also mildly tender in left lower quadrant.  + BS, No HSM or mass.  Urine HCG +    Assessment & Plan:  Concern for pelvic pain, vaginal bleeding in a woman at [redacted] weeks gestation for possible tubal pregnancy. She [facility] she can be evaluated for this quickly.  I am unable to obtain an OB ultrasound quickly through the orange card.   To [facility].

## 2016-05-11 NOTE — MAU Note (Signed)
Urine in lab 

## 2016-05-11 NOTE — MAU Provider Note (Signed)
History     CSN: [id number]  Arrival date and time: 05/11/16 1716   First Provider Initiated Contact with Patient 05/11/16 1752      No chief complaint on file.  HPI  Ms. Suzanne Rivas is a 46 y.o. G1P0 at [redacted]w[redacted]d who presents to MAU today with complaint of abdominal cramping and vaginal bleeding since yesterday. The patient was seen by PCP today and had +UPT and was sent for further evaluation. She rates her pain at 5/10 at the worst. She has not taken anything for pain. She states only spotting. she has worn a panty liner, but has not soaked it. She has had some nausea without vomiting, diarrhea or constipation.     OB History    Gravida Para Term Preterm AB Living   3         2   SAB TAB Ectopic Multiple Live Births                  History reviewed. No pertinent past medical history.  History reviewed. No pertinent surgical history.  Family History  Problem Relation Age of Onset  . Cancer Maternal Grandmother     cervical cancer    Social History  Substance Use Topics  . Smoking status: Never Smoker  . Smokeless tobacco: Never Used  . Alcohol use No     Comment: occasinally     Allergies:  Allergies  Allergen Reactions  . Latex Itching and Rash    Prescriptions Prior to Admission  Medication Sig Dispense Refill Last Dose  . acetaminophen (TYLENOL) 500 MG tablet Take 500 mg by mouth every 6 (six) hours as needed for headache.   Past Week at Unknown time  . ibuprofen (ADVIL,MOTRIN) 600 MG tablet 1 tab by mouth twice daily with food for next 2 weeks, then every 6 hours as needed thereafter. (Patient not taking: Reported on 06/20/2015) 60 tablet 1 Not Taking at Unknown time    Review of Systems  Constitutional: Negative for fever.  Gastrointestinal: Positive for abdominal pain and nausea. Negative for constipation, diarrhea and vomiting.  Genitourinary: Positive for vaginal bleeding. Negative for dysuria, frequency, urgency and vaginal discharge.    Physical Exam   Blood pressure 118/73, pulse 66, temperature 98.6 F (37 C), resp. rate 16, height 5\' 3"  (1.6 m), weight 113 lb (51.3 kg), last menstrual period 03/23/2016.  Physical Exam  Nursing note and vitals reviewed. Constitutional: She is oriented to person, place, and time. She appears well-developed and well-nourished. No distress.  HENT:  Head: Normocephalic and atraumatic.  Cardiovascular: Normal rate.   Respiratory: Effort normal.  GI: Soft. She exhibits no distension and no mass. There is no tenderness. There is no rebound and no guarding.  Genitourinary: Uterus is enlarged (slightly). Uterus is not tender. Cervix exhibits no motion tenderness, no discharge and no friability. Right adnexum displays no mass and no tenderness. Left adnexum displays no mass and no tenderness. There is bleeding (scant, light) in the vagina. Vaginal discharge (scant, white) found.  Neurological: She is alert and oriented to person, place, and time.  Skin: Skin is warm and dry. No erythema.  Psychiatric: She has a normal mood and affect.  Dilation: Closed Effacement (%): Thick Cervical Position: Posterior Exam by:: Magnus Sinning, PA-C   Results for orders placed or performed during the hospital encounter of 05/11/16 (from the past 24 hour(s))  CBC with Differential/Platelet     Status: None   Collection Time: 05/11/16  6:02 PM  Result Value Ref Range   WBC 7.1 4.0 - 10.5 K/uL   RBC 4.10 3.87 - 5.11 MIL/uL   Hemoglobin 13.5 12.0 - 15.0 g/dL   HCT 16.1 09.6 - 04.5 %   MCV 95.4 78.0 - 100.0 fL   MCH 32.9 26.0 - 34.0 pg   MCHC 34.5 30.0 - 36.0 g/dL   RDW 40.9 81.1 - 91.4 %   Platelets 262 150 - 400 K/uL   Neutrophils Relative % 70 %   Neutro Abs 5.0 1.7 - 7.7 K/uL   Lymphocytes Relative 21 %   Lymphs Abs 1.5 0.7 - 4.0 K/uL   Monocytes Relative 6 %   Monocytes Absolute 0.4 0.1 - 1.0 K/uL   Eosinophils Relative 2 %   Eosinophils Absolute 0.1 0.0 - 0.7 K/uL   Basophils Relative 1 %    Basophils Absolute 0.1 0.0 - 0.1 K/uL  ABO/Rh     Status: None (Preliminary result)   Collection Time: 05/11/16  6:02 PM  Result Value Ref Range   ABO/RH(D) O POS   hCG, quantitative, pregnancy     Status: Abnormal   Collection Time: 05/11/16  6:02 PM  Result Value Ref Range   hCG, Beta Chain, Quant, S 23,685 (H) <5 mIU/mL  Wet prep, genital     Status: Abnormal   Collection Time: 05/11/16  6:12 PM  Result Value Ref Range   Yeast Wet Prep HPF POC NONE SEEN NONE SEEN   Trich, Wet Prep NONE SEEN NONE SEEN   Clue Cells Wet Prep HPF POC PRESENT (A) NONE SEEN   WBC, Wet Prep HPF POC FEW (A) NONE SEEN   Sperm NONE SEEN    No results found.  US Ob Comp Less 14 Wks  Result Date: 05/11/2016 CLINICAL DATA:  Bleeding and abdominal pain.  Cramping x2 weeks. EXAM: OBSTETRIC <14 WK [place] AND TRANSVAGINAL OB US TECHNIQUE: Both transabdominal and transvaginal ultrasound examinations were performed for complete evaluation of the gestation as well as the maternal uterus, adnexal regions, and pelvic cul-de-sac. Transvaginal technique was performed to assess early pregnancy. COMPARISON:  None. FINDINGS: Intrauterine gestational sac: None Yolk sac:  Visualized. Embryo:  Visualized. Cardiac Activity: Not Visualized. Heart Rate: Not applicable CRL:  2.6  mm   5 w   5 d                  [place] EDC: Subchorionic hemorrhage: Perigestational subchorionic hemorrhage noted bilaterally measuring up to 4 cm in maximum dimension. Maternal uterus/adnexae: Corpus luteum on the right. Unremarkable left ovary. IMPRESSION: Perigestational subchorionic hemorrhage measuring to 4 cm in maximum dimension. Single intrauterine gestation without detectable fetal cardiac activity at this time possibly due to the early stage of gestation. Follow-up ultrasound in 10-14 days is recommended. Electronically Signed   By: Tollie Eth M.D.   On: 05/11/2016 19:22   US Ob Transvaginal  Result Date: 05/11/2016 CLINICAL DATA:  Bleeding and abdominal  pain.  Cramping x2 weeks. EXAM: OBSTETRIC <14 WK [place] AND TRANSVAGINAL OB US TECHNIQUE: Both transabdominal and transvaginal ultrasound examinations were performed for complete evaluation of the gestation as well as the maternal uterus, adnexal regions, and pelvic cul-de-sac. Transvaginal technique was performed to assess early pregnancy. COMPARISON:  None. FINDINGS: Intrauterine gestational sac: None Yolk sac:  Visualized. Embryo:  Visualized. Cardiac Activity: Not Visualized. Heart Rate: Not applicable CRL:  2.6  mm   5 w   5 d  US EDC: Subchorionic hemorrhage: Perigestational subchorionic hemorrhage noted bilaterally measuring up to 4 cm in maximum dimension. Maternal uterus/adnexae: Corpus luteum on the right. Unremarkable left ovary. IMPRESSION: Perigestational subchorionic hemorrhage measuring to 4 cm in maximum dimension. Single intrauterine gestation without detectable fetal cardiac activity at this time possibly due to the early stage of gestation. Follow-up ultrasound in 10-14 days is recommended. Electronically Signed   By: Tollie Ethavid  Kwon M.D.   On: 05/11/2016 19:22   MAU Course  Procedures None  MDM +UPT UA, wet prep, GC/chlamydia, CBC, ABO/Rh, quant hCG, HIV, RPR and US today to rule out ectopic pregnancy  Marny LowensteinJulie N Wenzel, PA-C  05/11/2016, 7:22 PM   US and labs reviewed. [facility], no FH activity and dating discrepancy, could be early gestation or failed pregnancy, will rpt US for viability. Discussed findings with pt. Stable for discharge home.  Assessment and Plan   1. [redacted] weeks gestation of pregnancy   2. Vaginal bleeding before [redacted] weeks gestation   3. Abdominal pain affecting pregnancy, antepartum   4. Blood type, Rh positive   5. Pregnancy with inconclusive fetal viability, single or unspecified fetus   6. Abdominal pain, left upper quadrant    Discharge home Follow up in 2 weeks for US SAB precautions Tylenol prn  Donette LarryMelanie Lillybeth Tal, CNM  05/11/2016 7:49 PM

## 2016-05-11 NOTE — MAU Note (Signed)
Pt presents to MAU with complaints of lower abdominal cramping with scant amount of vaginal bleeding that started on Saturday morning.

## 2016-05-11 NOTE — Patient Instructions (Signed)
Go to [facility] immediately for evaluation of vaginal bleeding with pelvic pain at [redacted] weeks gestation.

## 2016-05-12 LAB — RPR: RPR: NONREACTIVE

## 2016-05-12 LAB — HIV ANTIBODY (ROUTINE TESTING W REFLEX): HIV Screen 4th Generation wRfx: NONREACTIVE

## 2016-05-13 LAB — GC/CHLAMYDIA PROBE AMP (~~LOC~~) NOT AT ARMC
Chlamydia: NEGATIVE
NEISSERIA GONORRHEA: NEGATIVE

## 2016-05-19 ENCOUNTER — Inpatient Hospital Stay (HOSPITAL_COMMUNITY)
Admission: AD | Admit: 2016-05-19 | Discharge: 2016-05-19 | Disposition: A | Discharge status: Home / Self Care | Payer: Self-pay | Source: Ambulatory Visit | Attending: Obstetrics and Gynecology | Admitting: Obstetrics and Gynecology

## 2016-05-19 ENCOUNTER — Inpatient Hospital Stay (HOSPITAL_COMMUNITY): Payer: Self-pay

## 2016-05-19 ENCOUNTER — Encounter (HOSPITAL_COMMUNITY): Payer: Self-pay | Admitting: *Deleted

## 2016-05-19 DIAGNOSIS — O021 Missed abortion: Secondary | ICD-10-CM | POA: Insufficient documentation

## 2016-05-19 DIAGNOSIS — O209 Hemorrhage in early pregnancy, unspecified: Secondary | ICD-10-CM

## 2016-05-19 DIAGNOSIS — Z3A01 Less than 8 weeks gestation of pregnancy: Secondary | ICD-10-CM | POA: Insufficient documentation

## 2016-05-19 LAB — CBC
HEMATOCRIT: 39.1 % (ref 36.0–46.0)
HEMOGLOBIN: 13.2 g/dL (ref 12.0–15.0)
MCH: 32 pg (ref 26.0–34.0)
MCHC: 33.8 g/dL (ref 30.0–36.0)
MCV: 94.7 fL (ref 78.0–100.0)
PLATELETS: 239 10*3/uL (ref 150–400)
RBC: 4.13 MIL/uL (ref 3.87–5.11)
RDW: 12.9 % (ref 11.5–15.5)
WBC: 5.4 10*3/uL (ref 4.0–10.5)

## 2016-05-19 LAB — URINALYSIS, ROUTINE W REFLEX MICROSCOPIC
Bilirubin Urine: NEGATIVE
Glucose, UA: NEGATIVE mg/dL
KETONES UR: NEGATIVE mg/dL
LEUKOCYTES UA: NEGATIVE
Nitrite: NEGATIVE
PH: 6 (ref 5.0–8.0)
Protein, ur: NEGATIVE mg/dL
SPECIFIC GRAVITY, URINE: 1.004 — AB (ref 1.005–1.030)

## 2016-05-19 LAB — HCG, QUANTITATIVE, PREGNANCY: hCG, Beta Chain, Quant, S: 14564 m[IU]/mL — ABNORMAL HIGH (ref <5)

## 2016-05-19 MED ORDER — PROMETHAZINE HCL 25 MG PO TABS: 25.0000 mg | ORAL_TABLET | Freq: Four times a day (QID) | ORAL | 1 refills | Status: DC | PRN

## 2016-05-19 MED ORDER — IBUPROFEN 600 MG PO TABS: 600.0000 mg | ORAL_TABLET | Freq: Four times a day (QID) | ORAL | 1 refills | Status: DC | PRN

## 2016-05-19 MED ORDER — OXYCODONE-ACETAMINOPHEN 5-325 MG PO TABS: 1.0000 | ORAL_TABLET | Freq: Four times a day (QID) | ORAL | 0 refills | Status: DC | PRN

## 2016-05-19 MED ORDER — MISOPROSTOL 200 MCG PO TABS: ORAL_TABLET | ORAL | 1 refills | Status: DC

## 2016-05-19 NOTE — MAU Note (Signed)
c/o vaginal bleeding for past 12 days;scheduled for u/s for April 10th;[redacted]w[redacted]d gestation;

## 2016-05-19 NOTE — MAU Provider Note (Signed)
Chief Complaint: Vaginal Bleeding   First Provider Initiated Contact with Patient 05/19/16 1001        SUBJECTIVE HPI: Arleene Settle is a 46 y.o. G3P2002 at [redacted]w[redacted]d by LMP who presents to maternity admissions reporting bleeding.  Was seen for bleeding a week ago and has continued to bleed for past 12 days. Has a little cramping.  Last week US showed fetal pole measuring [redacted]w[redacted]d with no heartbeat. She denies vaginal itching/burning, urinary symptoms, h/a, dizziness, n/v, or fever/chills.    Vaginal Bleeding  The patient's primary symptoms include pelvic pain (very mild) and vaginal bleeding. The patient's pertinent negatives include no genital itching, genital lesions or genital odor. This is a recurrent problem. The current episode started 1 to 4 weeks ago. The problem occurs intermittently. The problem has been gradually worsening. The pain is mild. The problem affects both sides. She is pregnant. Associated symptoms include abdominal pain. Pertinent negatives include no back pain, constipation, diarrhea, dysuria, fever, nausea or vomiting. The vaginal discharge was bloody. The vaginal bleeding is spotting. She has been passing clots. She has not been passing tissue. Nothing aggravates the symptoms. She has tried nothing for the symptoms.   RN Note: Nursing    Hide copied text Hover for attribution information c/o vaginal bleeding for past 12 days;scheduled for u/s for April 10th;[redacted]w[redacted]d gestation;      Past Medical History:  Diagnosis Date  . Medical history non-contributory    Past Surgical History:  Procedure Laterality Date  . NO PAST SURGERIES     Social History   Social History  . Marital status: Single    Spouse name: N/A  . Number of children: 2  . Years of education: N/A   Occupational History  . Cook     [employer]   Social History Main Topics  . Smoking status: Never Smoker  . Smokeless tobacco: Never Used  . Alcohol use No     Comment: occasinally     . Drug use: No  . Sexual activity: Yes    Birth control/ protection: None   Other Topics Concern  . Not on file   Social History Narrative   Originally from [place]   Came to [place]. In [place], 1998   Lives at home with her 44 yo daughter.   No current facility-administered medications on file prior to encounter.    No current outpatient prescriptions on file prior to encounter.   Allergies  Allergen Reactions  . Latex Itching and Rash    I have reviewed patient's Past Medical Hx, Surgical Hx, Family Hx, Social Hx, medications and allergies.   ROS:  Review of Systems  Constitutional: Negative for fever.  Gastrointestinal: Positive for abdominal pain. Negative for constipation, diarrhea, nausea and vomiting.  Genitourinary: Positive for pelvic pain (very mild) and vaginal bleeding. Negative for dysuria.  Musculoskeletal: Negative for back pain.   Review of Systems  Other systems negative   Physical Exam  Physical Exam Patient Vitals for the past 24 hrs:  BP Temp Temp src Pulse Resp  05/19/16 0916 124/60 98.6 F (37 C) Oral 80 16   Constitutional: Well-developed, well-nourished female in no acute distress.  Cardiovascular: normal rate Respiratory: normal effort GI: Abd soft, non-tender. Pos BS x 4 MS: Extremities nontender, no edema, normal ROM Neurologic: Alert and oriented x 4.  GU: Neg CVAT.  PELVIC EXAM: Cervix pink, visually closed, without lesion, small amount of red discharge, vaginal walls and external genitalia normal  LAB RESULTS Results for orders placed or performed during the hospital encounter of 05/19/16 (from the past 24 hour(s))  Urinalysis, Routine w reflex microscopic     Status: Abnormal   Collection Time: 05/19/16  9:05 AM  Result Value Ref Range   Color, Urine STRAW (A) YELLOW   APPearance CLEAR CLEAR   Specific Gravity, Urine 1.004 (L) 1.005 - 1.030   pH 6.0 5.0 - 8.0   Glucose, UA NEGATIVE NEGATIVE mg/dL   Hgb urine dipstick LARGE  (A) NEGATIVE   Bilirubin Urine NEGATIVE NEGATIVE   Ketones, ur NEGATIVE NEGATIVE mg/dL   Protein, ur NEGATIVE NEGATIVE mg/dL   Nitrite NEGATIVE NEGATIVE   Leukocytes, UA NEGATIVE NEGATIVE   RBC / HPF 0-5 0 - 5 RBC/hpf   WBC, UA 0-5 0 - 5 WBC/hpf   Bacteria, UA RARE (A) NONE SEEN   Squamous Epithelial / LPF 0-5 (A) NONE SEEN  hCG, quantitative, pregnancy     Status: Abnormal   Collection Time: 05/19/16 10:12 AM  Result Value Ref Range   hCG, Beta Chain, Quant, S 14,564 (H) <5 mIU/mL  CBC     Status: None   Collection Time: 05/19/16 10:12 AM  Result Value Ref Range   WBC 5.4 4.0 - 10.5 K/uL   RBC 4.13 3.87 - 5.11 MIL/uL   Hemoglobin 13.2 12.0 - 15.0 g/dL   HCT 16.1 09.6 - 04.5 %   MCV 94.7 78.0 - 100.0 fL   MCH 32.0 26.0 - 34.0 pg   MCHC 33.8 30.0 - 36.0 g/dL   RDW 40.9 81.1 - 91.4 %   Platelets 239 150 - 400 K/uL     Ref. Range 05/11/2016 18:02  HCG, Beta Chain, Quant, S Latest Ref Range: <5 mIU/mL 23,685 (H)   --/--/O POS (03/27 1802)  IMAGING US Ob Comp Less 14 Wks  Result Date: 05/11/2016 CLINICAL DATA:  Bleeding and abdominal pain.  Cramping x2 weeks. EXAM: OBSTETRIC <14 WK [place] AND TRANSVAGINAL OB US TECHNIQUE: Both transabdominal and transvaginal ultrasound examinations were performed for complete evaluation of the gestation as well as the maternal uterus, adnexal regions, and pelvic cul-de-sac. Transvaginal technique was performed to assess early pregnancy. COMPARISON:  None. FINDINGS: Intrauterine gestational sac: None Yolk sac:  Visualized. Embryo:  Visualized. Cardiac Activity: Not Visualized. Heart Rate: Not applicable CRL:  2.6  mm   5 w   5 d                  [place] EDC: Subchorionic hemorrhage: Perigestational subchorionic hemorrhage noted bilaterally measuring up to 4 cm in maximum dimension. Maternal uterus/adnexae: Corpus luteum on the right. Unremarkable left ovary. IMPRESSION: Perigestational subchorionic hemorrhage measuring to 4 cm in maximum dimension. Single  intrauterine gestation without detectable fetal cardiac activity at this time possibly due to the early stage of gestation. Follow-up ultrasound in 10-14 days is recommended. Electronically Signed   By: Tollie Eth M.D.   On: 05/11/2016 19:22   US Ob Transvaginal  Result Date: 05/19/2016 CLINICAL DATA:  46 year old pregnant female presenting with worsening bleeding for 12 days. Quantitative beta HCG pending. EDC by earliest sonogram: 01/06/2017, projecting to an expected gestational age of [redacted] weeks 6 days. EXAM: TRANSVAGINAL OB ULTRASOUND TECHNIQUE: Transvaginal ultrasound was performed for complete evaluation of the gestation as well as the maternal uterus, adnexal regions, and pelvic cul-de-sac. COMPARISON:  05/11/2016 obstetric scan. FINDINGS: Intrauterine gestational sac: Single intrauterine gestational sac appears normal in position and slightly irregular in shape. Yolk sac:  Visualized and irregular in contour. Embryo:  Visualized. Embryonic Cardiac Activity: Not Visualized. CRL:   3.1  mm   5 w 6 d                  [place] EDC: 01/13/2017 Subchorionic hemorrhage:  None visualized. Maternal uterus/adnexae: Anteverted uterus. No uterine fibroids demonstrated. Right ovary measures 3.2 x 1.1 x 1.7 cm and contains a corpus luteum. Left ovary measures 2.4 x 1.9 x 1.9 cm. No abnormal ovarian or adnexal masses. No abnormal free fluid in the pelvis. IMPRESSION: 1. Single intrauterine gestation at 5 weeks 6 days by crown-rump length, with absence of significant interval growth of the embryo compared to the obstetric scan from 8 days prior. No embryonic cardiac activity detected. Irregular yolk sac and gestational sac contour. Findings are considered diagnostic of intrauterine demise. 2. No abnormal ovarian or adnexal findings. Electronically Signed   By: Delbert Phenix M.D.   On: 05/19/2016 11:14   US Ob Transvaginal  Result Date: 05/11/2016 CLINICAL DATA:  Bleeding and abdominal pain.  Cramping x2 weeks. EXAM:  OBSTETRIC <14 WK [place] AND TRANSVAGINAL OB US TECHNIQUE: Both transabdominal and transvaginal ultrasound examinations were performed for complete evaluation of the gestation as well as the maternal uterus, adnexal regions, and pelvic cul-de-sac. Transvaginal technique was performed to assess early pregnancy. COMPARISON:  None. FINDINGS: Intrauterine gestational sac: None Yolk sac:  Visualized. Embryo:  Visualized. Cardiac Activity: Not Visualized. Heart Rate: Not applicable CRL:  2.6  mm   5 w   5 d                  [place] EDC: Subchorionic hemorrhage: Perigestational subchorionic hemorrhage noted bilaterally measuring up to 4 cm in maximum dimension. Maternal uterus/adnexae: Corpus luteum on the right. Unremarkable left ovary. IMPRESSION: Perigestational subchorionic hemorrhage measuring to 4 cm in maximum dimension. Single intrauterine gestation without detectable fetal cardiac activity at this time possibly due to the early stage of gestation. Follow-up ultrasound in 10-14 days is recommended. Electronically Signed   By: Tollie Eth M.D.   On: 05/11/2016 19:22     MAU Management/MDM: usual first trimester r/o ectopic labs and Pelvic exam and cultures done at last visit [place] repeated to evaluate status of fetus  >> US showed demise CBC and HCG done.  HCG has decreased  This bleeding/pain can represent a normal pregnancy with bleeding, spontaneous abortion or even an ectopic which can be life-threatening.  The process as listed above helps to determine which of these is present. This with test results represents a fetal demise, missed abortion.   ASSESSMENT 1. Antepartum bleeding, first trimester   2.     Missed abortion at [redacted]w[redacted]d  PLAN Discharge home Discussed options with her and her fiance They elect to proceed with Cytotec Early Intrauterine Pregnancy Failure Protocol  X Documented intrauterine pregnancy failure less than or equal to [redacted] weeks gestation  X No serious current illness  X Baseline Hgb  greater than or equal to 10g/dl  X Patient has easily accessible transportation to the hospital  X Clear preference  X Practitioner/physician deems patient reliable  X Counseling by practitioner or physician  X Patient education by RN  Cytotec 800 mcg Buccally by patient at home  X Ibuprofen 600 mg 1 tablet by mouth every 6 hours as needed #30 - prescribed  X Percocet by mouth every 4 to 6 hours as needed - prescribed  _x_ Phenergan 25 mg by mouth every 6 hours  as needed for nausea - prescribed     Pt stable at time of discharge. Encouraged to return here or to other Urgent Care/ED if she develops worsening of symptoms, increase in pain, fever, or other concerning symptoms.    Wynelle Bourgeois CNM, MSN Certified Nurse-Midwife 05/19/2016  10:21 AM

## 2016-05-19 NOTE — Discharge Instructions (Signed)
Incomplete Miscarriage °A miscarriage is the sudden loss of an unborn baby (fetus) before the 20th week of pregnancy. In an incomplete miscarriage, parts of the fetus or placenta (afterbirth) remain in the body. °Having a miscarriage can be an emotional experience. Talk with your health care provider about any questions you may have about miscarrying, the grieving process, and your future pregnancy plans. °What are the causes? °· Problems with the fetal chromosomes that make it impossible for the baby to develop normally. Problems with the baby's genes or chromosomes are most often the result of errors that occur by chance as the embryo divides and grows. The problems are not inherited from the parents. °· Infection of the cervix or uterus. °· Hormone problems. °· Problems with the cervix, such as having an incompetent cervix. This is when the tissue in the cervix is not strong enough to hold the pregnancy. °· Problems with the uterus, such as an abnormally shaped uterus, uterine fibroids, or congenital abnormalities. °· Certain medical conditions. °· Smoking, drinking alcohol, or taking illegal drugs. °· Trauma. °What are the signs or symptoms? °· Vaginal bleeding or spotting, with or without cramps or pain. °· Pain or cramping in the abdomen or lower back. °· Passing fluid, tissue, or blood clots from the vagina. °How is this diagnosed? °Your health care provider will perform a physical exam. You may also have an ultrasound to confirm the miscarriage. Blood or urine tests may also be ordered. °How is this treated? °· Usually, a dilation and curettage (D&C) procedure is performed. During a D&C procedure, the cervix is widened (dilated) and any remaining fetal or placental tissue is gently removed from the uterus. °· Antibiotic medicines are prescribed if there is an infection. Other medicines may be given to reduce the size of the uterus (contract) if there is a lot of bleeding. °· If you have Rh negative blood and  your baby was Rh positive, you will need a Rho (D) immune globulin shot. This shot will protect any future baby from having Rh blood problems in future pregnancies. °· You may be confined to bed rest. This means you should stay in bed and only get up to use the bathroom. °Follow these instructions at home: °· Rest as directed by your health care provider. °· Restrict activity as directed by your health care provider. You may be allowed to continue light activity if curettage was not done but you require further treatment. °· Keep track of the number of pads you use each day. Keep track of how soaked (saturated) they are. Record this information. °· Do not  use tampons. °· Do not douche or have sexual intercourse until approved by your health care provider. °· Keep all follow-up appointments for reevaluation and continuing management. °· Only take over-the-counter or prescription medicines for pain, fever, or discomfort as directed by your health care provider. °· Take antibiotic medicine as directed by your health care provider. Make sure you finish it even if you start to feel better. °Get help right away if: °· You experience severe cramps in your stomach, back, or abdomen. °· You have an unexplained temperature (make sure to record these temperatures). °· You pass large clots or tissue (save these for your health care provider to inspect). °· Your bleeding increases. °· You become light-headed, weak, or have fainting episodes. °This information is not intended to replace advice given to you by your health care provider. Make sure you discuss any questions you have with your health   care provider. °Document Released: 02/01/2005 Document Revised: 07/10/2015 Document Reviewed: 08/31/2012 °Elsevier Interactive Patient Education © 2017 Else[employer] Inc[employer] ° °FACTS YOU SHOULD KNOW ° °WHAT IS AN EARLY PREGNANCY FAILURE? °Once the egg is fertilized with the sperm and begins to develop, it attaches to the lining of the uterus.  This early pregnancy tissue may not develop into an embryo (the beginning stage of a baby). Sometimes an embryo does develop but does not continue to grow. These problems can be seen on ultrasound.  ° °MANAGEMNT OF EARLY PREGNANCY FAILURE: °About 4 out of 100 (0.25%) women will have a pregnancy loss in her lifetime.  One in five pregnancies is found to be an early pregnancy failure.  There are 3 ways to care for an early pregnancy failure:   °(1) Surgery, (2) Medicine, (3) Waiting for you to pass the pregnancy on your own. °The decision as to how to proceed after being diagnosed with and early pregnancy failure is an individual one.  The decision can be made only after appropriate counseling.  You need to weigh the pros and cons of the 3 choices. Then you can make the choice that works for you. °SURGERY (D&E) °• Procedure over in 1 day °• Requires being put to sleep °• Bleeding may be light °• Possible problems during surgery, including injury to womb(uterus) °• Care provider has more control °Medicine (CYTOTEC) °• The complete procedure may take days to weeks °• No Surgery °• Bleeding may be heavy at times °• There may be drug side effects °• Patient has more control °Waiting °• You may choose to wait, in which case your own body may complete the passing of the abnormal early pregnancy on its own in about 2-4 weeks °• Your bleeding may be heavy at times °• There is a small possibility that you may need surgery if the bleeding is too much or not all of the pregnancy has passed. °CYTOTEC MANAGEMENT °Prostaglandins (cytotec) are the most widely used drug for this purpose. They cause the uterus to cramp and contract. You will place the medicine yourself inside your vagina in the privacy of your home. Empting of the uterus should occur within 3 days but the process may continue for several weeks. The bleeding may seem heavy at times. °POSSIBLE SIDE EFFECTS FROM CYTOTEC °• Nausea    Vomiting °• Diarrhea Fever °• Chills  Hot Flashes °Side effects  from the process of the early pregnancy failure include: °• Cramping  Bleeding °• Headaches  Dizziness °RISKS: °This is a low risk procedure. Less than 1 in 100 women has a complication. An incomplete passage of the early pregnancy may occur. Also, Hemorrhage (heavy bleeding) could happen.  Rarely the pregnancy will not be passed completely. Excessively heavy bleeding may occur.  Your doctor may need to perform surgery to empty the uterus (D&E). °Afterwards: °Everybody will feel differently after the early pregnancy completion. You may have soreness or cramps for a day or two. You may have soreness or cramps for day or two.  You may have light bleeding for up to 2 weeks. You may be as active as you feel like being. °If you have any of the following problems you may call [facility]missions [facility] at [phone]. °• If you have pain that does not get better  with pain medication °• Bleeding that soaks through 2 thick full-sized sanitary pads in an hour °• Cramps that last longer than 2 days °• Foul smelling discharge °• Fever above 100.4 degrees   F °Even if you do not have any of these symptoms, you should have a follow-up exam to make sure you are healing properly. This appointment will be made for you before you leave the hospital. Your next normal period will start again in 4-6 week after the loss. You can get pregnant soon after the loss, so use birth control right away. °Finally: °Make sure all your questions are answered before during and after any procedure. Follow up with medical care and family planning methods. ° °  ° ° °

## 2016-05-25 ENCOUNTER — Ambulatory Visit (HOSPITAL_COMMUNITY): Payer: Self-pay

## 2016-05-26 ENCOUNTER — Ambulatory Visit: Payer: Self-pay

## 2016-11-03 ENCOUNTER — Encounter: Payer: Self-pay | Admitting: Internal Medicine

## 2016-11-03 ENCOUNTER — Ambulatory Visit: Payer: Self-pay | Admitting: Internal Medicine

## 2016-11-05 ENCOUNTER — Encounter: Payer: Self-pay | Admitting: Internal Medicine

## 2016-11-05 ENCOUNTER — Ambulatory Visit (INDEPENDENT_AMBULATORY_CARE_PROVIDER_SITE_OTHER): Payer: Self-pay | Admitting: Internal Medicine

## 2016-11-05 VITALS — BP 118/64 | HR 70 | Resp 12 | Ht 63.0 in | Wt 117.0 lb

## 2016-11-05 DIAGNOSIS — H547 Unspecified visual loss: Secondary | ICD-10-CM

## 2016-11-05 DIAGNOSIS — Z124 Encounter for screening for malignant neoplasm of cervix: Secondary | ICD-10-CM

## 2016-11-05 DIAGNOSIS — Z1231 Encounter for screening mammogram for malignant neoplasm of breast: Secondary | ICD-10-CM

## 2016-11-05 DIAGNOSIS — Z1239 Encounter for other screening for malignant neoplasm of breast: Secondary | ICD-10-CM

## 2016-11-05 DIAGNOSIS — Z13220 Encounter for screening for lipoid disorders: Secondary | ICD-10-CM

## 2016-11-05 DIAGNOSIS — Z0000 Encounter for general adult medical examination without abnormal findings: Secondary | ICD-10-CM

## 2016-11-05 DIAGNOSIS — Z23 Encounter for immunization: Secondary | ICD-10-CM

## 2016-11-05 DIAGNOSIS — M79641 Pain in right hand: Secondary | ICD-10-CM

## 2016-11-05 DIAGNOSIS — N898 Other specified noninflammatory disorders of vagina: Secondary | ICD-10-CM

## 2016-11-05 LAB — POCT WET PREP WITH KOH
KOH PREP POC: NEGATIVE
TRICHOMONAS UA: NEGATIVE
Yeast Wet Prep HPF POC: NEGATIVE

## 2016-11-05 NOTE — Progress Notes (Signed)
Subjective:    Patient ID: Suzanne Rivas, female    DOB: 1970/08/13, 46 y.o.   MRN: [id number]  HPI   CPE with pap  1.  Pap:  Last 06/2013.  Always normal.  Maternal Grandmother with history of death from cervical cancer in [place].    2.  Mammogram:  Last mammogram was in 2015 as well.  Normal as well.  No family history of breast cancer.  3.  Osteoprevention:  Lactaid milk 1 cup every other day. Not much in way of other dairy.  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 No family history of osteoporosis.  4.  Guaiac Cards:  Has never performed.  5.  Colonoscopy: Has never had performed.  No family history of colon cancer.  6.  Immunizations:  Has not had a Tetanus vaccine in past 10 years.   Discussed influenza vaccines at health fair next Saturday.  7.  Glucose/Cholesterol:  Cholesterol panel was good in 2015.  Glucose never elevated  No outpatient prescriptions have been marked as taking for the 11/05/16 encounter (Office Visit) with Julieanne Manson, MD.    Allergies  Allergen Reactions  . Latex Itching and Rash    Past Medical History:  Diagnosis Date  . SAB (spontaneous abortion) 05/2015    Past Surgical History:  Procedure Laterality Date  . NO PAST SURGERIES      Family History  Problem Relation Age of Onset  . Arthritis Mother   . Cancer Maternal Grandmother        cervical cancer    Social History   Social History  . Marital status: Single    Spouse name: N/A  . Number of children: 2  . Years of education: 1 year university   Occupational History  . Cook     [employer]   Social History Main Topics  . Smoking status: Never Smoker  . Smokeless tobacco: Never Used  . Alcohol use 0.0 oz/week     Comment: occasionally a glass of wine  . Drug use: No  . Sexual activity: Yes    Birth control/ protection: None   Other Topics Concern  . Not on file   Social History Narrative   Originally  from [place]   Came to [employer] [place] [employer]. In December 24, 1998   Lives at home with her teenage daughter--daughter goes to [employer] in art/painting.     Review of Systems  Constitutional: Negative for fatigue, fever and unexpected weight change.  HENT: Negative for dental problem, ear pain, hearing loss, nosebleeds, rhinorrhea, sinus pain and sore throat.   Eyes: Positive for visual disturbance (Wears glasses, but not working well for her.  Needs appointmen).  Respiratory: Negative for shortness of breath.   Cardiovascular: Positive for chest pain (upper left sternal pain.  Does not change what she is doing.  ). Negative for palpitations and leg swelling.  Gastrointestinal: Negative for abdominal pain, blood in stool (No melena), constipation, diarrhea, nausea and vomiting.  Genitourinary: Negative for dysuria, frequency, menstrual problem and vaginal discharge.  Musculoskeletal: Positive for arthralgias (2 months ago.  Pain in between 2nd and 3rd metacarpal rays of right hand.  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  No history of injury.  Intermittent.  Takes Tylenol with some help.  No NSAIDs.  ).  Skin: Negative for rash.  Neurological: Negative for weakness and numbness.  Hematological: Negative for adenopathy. Does not bruise/bleed easily.  Psychiatric/Behavioral: Negative for dysphoric mood. The patient is not nervous/anxious.        Miscarried at 7 weeks in the spring.  Interested in getting Natosha Knight's phone number to counsel.       Objective:   Physical Exam  Constitutional: She is oriented to person, place, and time. She appears well-developed and well-nourished.  HENT:  Head: Normocephalic and atraumatic.  Right Ear: Hearing, tympanic membrane, external ear and ear canal normal.  Left Ear: Hearing, tympanic membrane, external ear and ear canal normal.  Nose: Nose normal.  Mouth/Throat: Uvula is midline, oropharynx is clear and moist and mucous membranes are normal.  Eyes: Pupils  are equal, round, and reactive to light. Conjunctivae are normal.  Mild alternating strabismus bilaterally Discs sharp bilaterally  Neck: Normal range of motion and full passive range of motion without pain. Neck supple. No thyromegaly present.  Cardiovascular: Normal rate, regular rhythm, S1 normal and S2 normal.  Exam reveals no S3, no S4 and no friction rub.   No murmur heard. No carotid bruits.  Carotid, radial, femoral, DP and PT pulses normal and equal.   Pulmonary/Chest: Effort normal and breath sounds normal. Right breast exhibits no inverted nipple, no mass, no nipple discharge, no skin change and no tenderness. Left breast exhibits no inverted nipple, no mass, no nipple discharge, no skin change and no tenderness.  Abdominal: Soft. Bowel sounds are normal. She exhibits no mass. There is no hepatosplenomegaly. There is no tenderness. No hernia.  Genitourinary:  Genitourinary Comments: Normal external genitalia.  No cervical lesions.  No CMT.  No uterine or adnexal mass or tenderness.  Creamy white vaginal discharge just at apex of vaginal canal.  No underlying erythema.  Musculoskeletal: Normal range of motion.       Hands: Lymphadenopathy:       Head (right side): No submental and no submandibular adenopathy present.       Head (left side): No submental and no submandibular adenopathy present.    She has no cervical adenopathy.    She has no axillary adenopathy.       Right: No inguinal and no supraclavicular adenopathy present.       Left: No inguinal and no supraclavicular adenopathy present.  Neurological: She is alert and oriented to person, place, and time. She has normal strength and normal reflexes. No cranial nerve deficit or sensory deficit. Coordination and gait normal.  Skin: Skin is warm. No rash noted.  Psychiatric: She has a normal mood and affect. Her speech is normal and behavior is normal. Judgment and thought content normal. Cognition and memory are normal.         Assessment & Plan:  1.  CPE with pap Bacterial Vaginosis on wet prep exam, but patient asymptomatic, so no treatment. Schedule Mammogram Tdap CBC, CMP, FLP  2.  Vaginal Discharge:  With significant WBCs on wet prep, check GC/chlamydia.  3.  Decreased visual acuity with current lenses--optometry referral.  4.  Loss of pregnancy in spring.  Given Samul Dada, LCSW number in case feels need for counseling.  5.  Right hand pain:  Referral to [facility].

## 2016-11-05 NOTE — Patient Instructions (Signed)
Free flu vaccine at flu vaccine clinics:  September 20 8:30 to 11:30 a.m. And September 29th [facility] 1-4 p.m.  Both at [facility] next door to clinic  

## 2016-11-06 LAB — CBC WITH DIFFERENTIAL/PLATELET
BASOS ABS: 0.1 10*3/uL (ref 0.0–0.2)
Basos: 1 %
EOS (ABSOLUTE): 0.1 10*3/uL (ref 0.0–0.4)
EOS: 2 %
HEMOGLOBIN: 13.8 g/dL (ref 11.1–15.9)
Hematocrit: 40.9 % (ref 34.0–46.6)
IMMATURE GRANS (ABS): 0 10*3/uL (ref 0.0–0.1)
IMMATURE GRANULOCYTES: 0 %
LYMPHS: 33 %
Lymphocytes Absolute: 1.3 10*3/uL (ref 0.7–3.1)
MCH: 31.7 pg (ref 26.6–33.0)
MCHC: 33.7 g/dL (ref 31.5–35.7)
MCV: 94 fL (ref 79–97)
MONOCYTES: 7 %
MONOS ABS: 0.3 10*3/uL (ref 0.1–0.9)
NEUTROS PCT: 57 %
Neutrophils Absolute: 2.3 10*3/uL (ref 1.4–7.0)
PLATELETS: 247 10*3/uL (ref 150–379)
RBC: 4.36 x10E6/uL (ref 3.77–5.28)
RDW: 13.5 % (ref 12.3–15.4)
WBC: 3.9 10*3/uL (ref 3.4–10.8)

## 2016-11-06 LAB — COMPREHENSIVE METABOLIC PANEL
ALBUMIN: 4.6 g/dL (ref 3.5–5.5)
ALT: 9 IU/L (ref 0–32)
AST: 13 IU/L (ref 0–40)
Albumin/Globulin Ratio: 2 (ref 1.2–2.2)
Alkaline Phosphatase: 51 IU/L (ref 39–117)
BUN / CREAT RATIO: 20 (ref 9–23)
BUN: 12 mg/dL (ref 6–24)
Bilirubin Total: 0.5 mg/dL (ref 0.0–1.2)
CALCIUM: 9.4 mg/dL (ref 8.7–10.2)
CO2: 22 mmol/L (ref 20–29)
Chloride: 103 mmol/L (ref 96–106)
Creatinine, Ser: 0.59 mg/dL (ref 0.57–1.00)
GFR, EST AFRICAN AMERICAN: 129 mL/min/{1.73_m2} (ref >59)
GFR, EST NON AFRICAN AMERICAN: 112 mL/min/{1.73_m2} (ref >59)
GLOBULIN, TOTAL: 2.3 g/dL (ref 1.5–4.5)
Glucose: 83 mg/dL (ref 65–99)
POTASSIUM: 4.5 mmol/L (ref 3.5–5.2)
SODIUM: 138 mmol/L (ref 134–144)
TOTAL PROTEIN: 6.9 g/dL (ref 6.0–8.5)

## 2016-11-06 LAB — LIPID PANEL W/O CHOL/HDL RATIO
Cholesterol, Total: 193 mg/dL (ref 100–199)
HDL: 90 mg/dL (ref >39)
LDL Calculated: 96 mg/dL (ref 0–99)
Triglycerides: 37 mg/dL (ref 0–149)
VLDL Cholesterol Cal: 7 mg/dL (ref 5–40)

## 2016-11-08 LAB — CYTOLOGY - PAP

## 2016-11-08 NOTE — Progress Notes (Signed)
Referral printed and faxed to facility 

## 2016-11-09 LAB — GC/CHLAMYDIA PROBE AMP
Chlamydia trachomatis, NAA: NEGATIVE
Neisseria gonorrhoeae by PCR: NEGATIVE

## 2016-11-10 NOTE — Progress Notes (Signed)
Referral on hold until new [facility] received

## 2016-12-06 ENCOUNTER — Ambulatory Visit
Admission: RE | Admit: 2016-12-06 | Discharge: 2016-12-06 | Disposition: A | Discharge status: Home / Self Care | Payer: No Typology Code available for payment source | Source: Ambulatory Visit | Attending: Internal Medicine | Admitting: Internal Medicine

## 2016-12-06 DIAGNOSIS — Z1239 Encounter for other screening for malignant neoplasm of breast: Secondary | ICD-10-CM

## 2016-12-08 ENCOUNTER — Other Ambulatory Visit: Payer: Self-pay | Admitting: Internal Medicine

## 2016-12-08 DIAGNOSIS — R928 Other abnormal and inconclusive findings on diagnostic imaging of breast: Secondary | ICD-10-CM

## 2016-12-13 ENCOUNTER — Other Ambulatory Visit (INDEPENDENT_AMBULATORY_CARE_PROVIDER_SITE_OTHER): Payer: Self-pay

## 2016-12-13 DIAGNOSIS — Z1211 Encounter for screening for malignant neoplasm of colon: Secondary | ICD-10-CM

## 2016-12-13 LAB — POC HEMOCCULT BLD/STL (HOME/3-CARD/SCREEN)
Card #3 Fecal Occult Blood, POC: NEGATIVE
FECAL OCCULT BLD: NEGATIVE
Fecal Occult Blood, POC: NEGATIVE

## 2016-12-21 ENCOUNTER — Other Ambulatory Visit (HOSPITAL_COMMUNITY): Payer: Self-pay | Admitting: *Deleted

## 2016-12-21 DIAGNOSIS — R928 Other abnormal and inconclusive findings on diagnostic imaging of breast: Secondary | ICD-10-CM

## 2016-12-30 ENCOUNTER — Ambulatory Visit
Admission: RE | Admit: 2016-12-30 | Discharge: 2016-12-30 | Disposition: A | Discharge status: Home / Self Care | Payer: No Typology Code available for payment source | Source: Ambulatory Visit | Attending: Obstetrics and Gynecology | Admitting: Obstetrics and Gynecology

## 2016-12-30 ENCOUNTER — Encounter (HOSPITAL_COMMUNITY): Payer: Self-pay

## 2016-12-30 ENCOUNTER — Ambulatory Visit (HOSPITAL_COMMUNITY)
Admission: RE | Admit: 2016-12-30 | Discharge: 2016-12-30 | Disposition: A | Discharge status: Home / Self Care | Payer: Self-pay | Source: Ambulatory Visit | Attending: Obstetrics and Gynecology | Admitting: Obstetrics and Gynecology

## 2016-12-30 VITALS — BP 100/60 | Temp 98.4°F | Wt 119.8 lb

## 2016-12-30 DIAGNOSIS — R928 Other abnormal and inconclusive findings on diagnostic imaging of breast: Secondary | ICD-10-CM

## 2016-12-30 DIAGNOSIS — Z1239 Encounter for other screening for malignant neoplasm of breast: Secondary | ICD-10-CM

## 2016-12-30 DIAGNOSIS — N644 Mastodynia: Secondary | ICD-10-CM

## 2016-12-30 NOTE — Patient Instructions (Signed)
Explained breast self awareness with Suzanne Rivas. Patient did not need a Pap smear today due to last Pap smear was 11/05/2016. Let her know BCCCP will cover Pap smears every 3 years unless has a history of abnormal Pap smears. [facility] left breast diagnostic mammogram and breast ultrasound per recommendation. Appointment scheduled for Thursday, December 30, 2016 at 1450. [name] verbalized understanding.  Kamara Allan, Kathaleen Maserhristine Poll, RN 1:49 PM

## 2016-12-30 NOTE — Progress Notes (Signed)
[facility] due to needing additional imaging of the left breast. Screening mammogram completed 12/06/2016. Patient complained of left breast pain that comes and goes x 6-7 months. Patient rated pain at a 6-7 out of 10.  Pap Smear: Pap smear not completed today. [facility] and normal. Per patient has no history of an abnormal Pap smear. Last Pap smear result is in Epic.  Physical exam: Breasts Left breast slightly larger than right breast that per patient is normal for her. No skin abnormalities bilateral breasts. No nipple retraction bilateral breasts. No nipple discharge bilateral breasts. No lymphadenopathy. No lumps palpated bilateral breasts. Complaints of left breast tenderness between 6-7 o'clock on exam. [facility] for a left breast diagnostic mammogram and breast ultrasound per recommendation. Appointment scheduled for Thursday, December 30, 2016 at 1450.        Pelvic/Bimanual No Pap smear completed today since last Pap smear was 11/05/2016. Pap smear not indicated per BCCCP guidelines.   Smoking History: Patient has never smoked.  Patient Navigation: Patient education provided. Access to services provided for [facility].

## 2016-12-31 ENCOUNTER — Encounter (HOSPITAL_COMMUNITY): Payer: Self-pay | Admitting: *Deleted

## 2017-11-02 ENCOUNTER — Telehealth: Payer: Self-pay | Admitting: Internal Medicine

## 2017-11-02 NOTE — Telephone Encounter (Signed)
Patient called stating received a  Mammogram letter reminder by mail and will like to get a Mammogram Scholarship for it.   Please advise.

## 2017-11-02 NOTE — Telephone Encounter (Signed)
Staff message sent to Stoney BangSabrina Holland to call patient to schedule appointment.

## 2017-11-07 ENCOUNTER — Other Ambulatory Visit: Payer: Self-pay | Admitting: Obstetrics and Gynecology

## 2017-11-07 DIAGNOSIS — Z1231 Encounter for screening mammogram for malignant neoplasm of breast: Secondary | ICD-10-CM

## 2017-11-08 ENCOUNTER — Encounter: Payer: Self-pay | Admitting: Internal Medicine

## 2017-11-08 ENCOUNTER — Ambulatory Visit: Payer: Self-pay | Admitting: Internal Medicine

## 2017-11-08 VITALS — BP 124/88 | HR 80 | Resp 12 | Ht 63.0 in | Wt 128.0 lb

## 2017-11-08 DIAGNOSIS — M722 Plantar fascial fibromatosis: Secondary | ICD-10-CM | POA: Insufficient documentation

## 2017-11-08 MED ORDER — IBUPROFEN 200 MG PO TABS: ORAL_TABLET | ORAL | Status: AC

## 2017-11-08 NOTE — Patient Instructions (Signed)
Frozen water bottle exercises and foot stretches.   Stretch out foot on side of bed when get up each day. Good cushioned slippers--no barefeet or flip flops Heel cups for shoes at work and elsewhere

## 2017-11-08 NOTE — Progress Notes (Signed)
   Subjective:    Patient ID: Suzanne Rivas, female    DOB: 08/11/1970, 47 y.o.   MRN: [id number]  HPI   Left heel pain for 2.5 months.  Most of the pain is no plantar surface of heel and medial side of heel.   It feels inflamed, but no visualized swelling.  No erythema.   No history of injury.   She wears slip resistant clogs.  Hard floors at work and home. Wears slippers at home that are slide ons.  The bottoms are thin. Hurts all the time.  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 Painful when she gets up in morning or when sitting for a while--walks on the ball of her foot. Has been using kinesiology tape and soaking in Epsom salts as well as using banded arch foot cushions and an ankle brace.  Not helping.  Has had this before and stretches helped.  Has taken Ibuprofen 200-400 mg here and there.  Does not help.   Current Meds  Medication Sig  . ibuprofen (ADVIL,MOTRIN) 200 MG tablet Take 200 mg by mouth every 6 (six) hours as needed.   Allergies  Allergen Reactions  . Latex Itching and Rash     Review of Systems     Objective:   Physical Exam  Right foot:  Exquisitely tender over plantar surface of left heel.  Mild tenderness and swelling over medial aspect of heel, just behind arch not quite extending to medial ankle.   Full ROM of ankle.      Assessment & Plan:  Left plantar fasciitis:  Ibuprofen 600 mg twice daily with meals for 2 weeks. Heel cups. Stretches and frozen water bottle rolls twice daily. No barefeet/flip flops or open heeled shoes PT referral.

## 2018-01-19 ENCOUNTER — Ambulatory Visit (HOSPITAL_COMMUNITY)
Admission: RE | Admit: 2018-01-19 | Discharge: 2018-01-19 | Disposition: A | Discharge status: Home / Self Care | Payer: No Typology Code available for payment source | Source: Ambulatory Visit | Attending: Obstetrics and Gynecology | Admitting: Obstetrics and Gynecology

## 2018-01-19 ENCOUNTER — Encounter (HOSPITAL_COMMUNITY): Payer: Self-pay

## 2018-01-19 ENCOUNTER — Ambulatory Visit
Admission: RE | Admit: 2018-01-19 | Discharge: 2018-01-19 | Disposition: A | Discharge status: Home / Self Care | Payer: Self-pay | Source: Ambulatory Visit | Attending: Obstetrics and Gynecology | Admitting: Obstetrics and Gynecology

## 2018-01-19 ENCOUNTER — Encounter (HOSPITAL_COMMUNITY): Payer: Self-pay | Admitting: *Deleted

## 2018-01-19 VITALS — BP 110/72 | Ht 63.0 in | Wt 130.0 lb

## 2018-01-19 DIAGNOSIS — Z1231 Encounter for screening mammogram for malignant neoplasm of breast: Secondary | ICD-10-CM

## 2018-01-19 DIAGNOSIS — Z1239 Encounter for other screening for malignant neoplasm of breast: Secondary | ICD-10-CM

## 2018-01-19 NOTE — Progress Notes (Addendum)
No complaints today.   Pap Smear: ap smear not completed today. [facility] and normal. Per patient has no history of an abnormal Pap smear. Last Pap smear result is in Epic.  Physical exam: Breasts Left breast slightly larger than right breast that per patient is normal for her. No skin abnormalities bilateral breasts. No nipple retraction bilateral breasts. No nipple discharge bilateral breasts. No lymphadenopathy. No lumps palpated bilateral breasts. No complaints of pain or tenderness on exam. [facility] mamm[facility]. Appointment scheduled for Thursday, January 19, 2018 at 1410.        Pelvic/Bimanual No Pap smear completed today since last Pap smear was 11/05/2016. Pap smear not indicated per BCCCP guidelines.   Smoking History: Patient has never smoked.  Patient Navigation: Patient education provided. Access to services provided for patient through BCCCP program.   Breast and Cervical Cancer Risk Assessment: Patient has no family history of breast cancer, known genetic mutations, or radiation treatment to the chest before age 45. Patient has no history of cervical dysplasia, immunocompromised, or DES exposure in-utero.  Risk Assessment    Risk Scores      01/19/2018   Last edited by: Priscille HeidelbergBrannock, Tou Hayner P, RN   5-year risk: 0.5 %   Lifetime risk: 6.1 %

## 2018-01-19 NOTE — Patient Instructions (Signed)
Explained breast self awareness with Suzanne Rivas. Patient did not need a Pap smear today due to last Pap smear was 11/05/2016. Let her know BCCCP will cover Pap smears every 3 years unless has a history of abnormal Pap smears. [facility] mamm[facility]. Appointment scheduled for Thursday, January 19, 2018 at 1410. Patient aware of appointment and will be there. Let patient know the Breast Center will follow up with her within the next couple weeks with results of mammogram by letter or phone. [name] verbalized understanding.  Kita Neace, Kathaleen Maserhristine Poll, RN 12:42 PM

## 2018-01-19 NOTE — Addendum Note (Signed)
Encounter addended by: Priscille HeidelbergBrannock, Elodia Haviland P, RN on: 01/19/2018 12:55 PM  Actions taken: Sign clinical note

## 2018-02-03 ENCOUNTER — Ambulatory Visit: Payer: Self-pay | Admitting: Internal Medicine

## 2019-11-16 ENCOUNTER — Other Ambulatory Visit: Payer: Self-pay | Admitting: Internal Medicine

## 2019-11-16 ENCOUNTER — Ambulatory Visit: Payer: Self-pay | Admitting: Internal Medicine

## 2019-11-16 ENCOUNTER — Encounter: Payer: Self-pay | Admitting: Internal Medicine

## 2019-11-16 VITALS — BP 114/68 | HR 68 | Resp 14 | Ht 62.75 in | Wt 125.8 lb

## 2019-11-16 DIAGNOSIS — Z0000 Encounter for general adult medical examination without abnormal findings: Secondary | ICD-10-CM

## 2019-11-16 DIAGNOSIS — Z1231 Encounter for screening mammogram for malignant neoplasm of breast: Secondary | ICD-10-CM

## 2019-11-16 DIAGNOSIS — Z124 Encounter for screening for malignant neoplasm of cervix: Secondary | ICD-10-CM

## 2019-11-16 NOTE — Progress Notes (Signed)
Subjective:    Patient ID: Suzanne Rivas, female   DOB: 28-Feb-1970, 49 y.o.   MRN: [id number]   HPI   CPE with pap  1.  Pap:  Normal in 2018.  Always normal.  2.  Mammogram:  Last mammogram 01/2018 and normal.  No family history of breast cancer.  Pt. G3P2SAB1 with 2 full term deliveries.  Nursed both for 6 months.  No history of BCP use.   3.  Osteoprevention:  1 serving milk daily.  Drinks lactose free.  Willing to drink 3-4 cups of some sort of fortified milk daily.  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    4.  Guaiac Cards:  Last performed in 2018 and negative.    5.  Colonoscopy:  Never.  No family history of colon cancer.  6.  Immunizations:   Immunization History  Administered Date(s) Administered   Moderna SARS-COVID-2 Vaccination 02/26/2019, 03/26/2019   Tdap 11/05/2016     7.  Glucose/Cholesterol:  Excellent in past.  Lipid Panel     Component Value Date/Time   CHOL 193 11/05/2016 1002   TRIG 37 11/05/2016 1002   HDL 90 11/05/2016 1002   CHOLHDL 2.6 06/08/2013 0947   VLDL 13 06/08/2013 0947   LDLCALC 96 11/05/2016 1002   LABVLDL 7 11/05/2016 1002     No outpatient medications have been marked as taking for the 11/16/19 encounter (Office Visit) with Julieanne Manson, MD.   Allergies  Allergen Reactions   Latex Itching and Rash   Past Medical History:  Diagnosis Date   SAB (spontaneous abortion) 05/2015    Past Surgical History:  Procedure Laterality Date   NO PAST SURGERIES      Family History  Problem Relation Age of Onset   Arthritis Mother    Cancer Maternal Grandmother        cervical cancer    Social History   Socioeconomic History   Marital status: Significant Other    Spouse name: Not on file   Number of children: 2   Years of education: 1 year university   Highest education level: Not on file  Occupational History   Occupation: Cook    Comment: [employer]  Tobacco Use   Smoking status: Never Smoker    Smokeless tobacco: Never Used  Building services engineer Use: Never used  Substance and Sexual Activity   Alcohol use: Yes    Alcohol/week: 0.0 standard drinks    Comment: occasionally a glass of wine   Drug use: No   Sexual activity: Yes    Birth control/protection: None  Other Topics Concern   Not on file  Social History Narrative   Originally from [place]   Came to [employer]. In December 24, 1998   Lives at home with her daughter--daughter goes to [employer] in Chief Financial Officer program   Social Determinants of Health   Financial Resource Strain:    Difficulty of Paying Living Expenses: Not on file  Food Insecurity: No Food Insecurity   Worried About Programme researcher, broadcasting/film/video in the Last Year: Never true   Barista in the Last Year: Never true  Transportation Needs: No Transportation Needs   Lack of Transportation (Medical): No   Lack of Transportation (Non-Medical): No  Physical Activity:    Days of Exercise per Week: Not on file   Minutes of Exercise per Session: Not on file  Stress:    Feeling  of Stress : Not on file  Social Connections:    Frequency of Communication with Friends and Family: Not on file   Frequency of Social Gatherings with Friends and Family: Not on file   Attends Religious Services: Not on file   Active Member of Clubs or Organizations: Not on file   Attends Banker Meetings: Not on file   Marital Status: Not on file  Intimate Partner Violence: Not At Risk   Fear of Current or Ex-Partner: No   Emotionally Abused: No   Physically Abused: No   Sexually Abused: No     Review of Systems  Constitutional: Negative for appetite change and fatigue.  HENT: Negative for dental problem (Has dental clinic appt in 2 weeks.), hearing loss, rhinorrhea and sore throat.   Eyes: Negative for visual disturbance (Reading glasses only.).  Respiratory: Negative for cough and shortness of breath.   Cardiovascular: Negative for chest pain, palpitations and leg  swelling.  Gastrointestinal: Negative for abdominal pain, blood in stool (No melena), constipation and diarrhea.  Genitourinary: Negative for dysuria, menstrual problem and vaginal discharge.  Musculoskeletal: Negative for arthralgias.  Skin: Negative for rash.  Neurological: Negative for dizziness, weakness and numbness.  Psychiatric/Behavioral: Negative for dysphoric mood. The patient is not nervous/anxious.       Objective:   BP 114/68 (BP Location: Right Arm, Patient Position: Sitting, Cuff Size: Normal)   Pulse 68   Resp 14   Ht 5' 2.75" (1.594 m)   Wt 125 lb 12 oz (57 kg)   LMP 11/16/2019 (Exact Date)   BMI 22.45 kg/m   Physical Exam Constitutional:      Appearance: Normal appearance. She is normal weight.  HENT:     Head: Normocephalic and atraumatic.     Right Ear: Tympanic membrane, ear canal and external ear normal.     Left Ear: Tympanic membrane, ear canal and external ear normal.     Nose: Nose normal.     Mouth/Throat:     Mouth: Mucous membranes are moist.     Pharynx: Oropharynx is clear.  Eyes:     Extraocular Movements: Extraocular movements intact.     Conjunctiva/sclera: Conjunctivae normal.     Pupils: Pupils are equal, round, and reactive to light.     Comments: Discs sharp bilaterally.  Neck:     Thyroid: No thyroid mass or thyromegaly.  Cardiovascular:     Rate and Rhythm: Normal rate and regular rhythm.     Heart sounds: S1 normal and S2 normal. No murmur heard.   No friction rub. No S3 or S4 sounds.     Comments: No carotid bruits.  Carotid, radial, femoral, DP and PT pulses normal and equal.    Pulmonary:     Effort: Pulmonary effort is normal.     Breath sounds: Normal breath sounds.  Chest:  Breasts:    Right: No swelling, inverted nipple, mass, nipple discharge or skin change.     Left: No swelling, inverted nipple, mass, nipple discharge or skin change.  Abdominal:     General: Abdomen is flat. Bowel sounds are normal.      Palpations: Abdomen is soft. There is no hepatomegaly, splenomegaly or mass.     Tenderness: There is no abdominal tenderness.     Hernia: No hernia is present.  Genitourinary:    Comments: Normal external female genitalia. No vaginal discharge other than scant blood from cervical os. No uterine or adnexal mass or tenderness. Musculoskeletal:  General: Normal range of motion.     Cervical back: Normal range of motion and neck supple.     Right lower leg: No edema.     Left lower leg: No edema.  Lymphadenopathy:     Head:     Right side of head: No submental or submandibular adenopathy.     Left side of head: No submental or submandibular adenopathy.     Cervical: No cervical adenopathy.     Upper Body:     Right upper body: No supraclavicular or axillary adenopathy.     Left upper body: No supraclavicular or axillary adenopathy.     Lower Body: No right inguinal adenopathy. No left inguinal adenopathy.  Skin:    General: Skin is warm.     Capillary Refill: Capillary refill takes less than 2 seconds.     Findings: No rash.  Neurological:     Mental Status: She is alert.     Cranial Nerves: Cranial nerves are intact.     Sensory: Sensation is intact.     Motor: Motor function is intact.     Coordination: Coordination is intact.     Gait: Gait is intact.     Deep Tendon Reflexes: Reflexes are normal and symmetric.  Psychiatric:        Attention and Perception: Attention normal.        Mood and Affect: Mood normal.        Speech: Speech normal.        Behavior: Behavior normal. Behavior is cooperative.        Thought Content: Thought content normal.        Judgment: Judgment normal.   Scant blood with period  Assessment & Plan  1.  CPE with pap Guaiac cards x 3 to return in 2 weeks when in for fasting labs:  FLP, CBC, CMP Mammogram ordered. Obtains covid vaccines at work.

## 2019-11-19 ENCOUNTER — Other Ambulatory Visit: Payer: Self-pay

## 2019-11-19 LAB — CYTOLOGY - PAP

## 2019-11-30 ENCOUNTER — Other Ambulatory Visit (INDEPENDENT_AMBULATORY_CARE_PROVIDER_SITE_OTHER): Payer: Self-pay

## 2019-11-30 DIAGNOSIS — Z1211 Encounter for screening for malignant neoplasm of colon: Secondary | ICD-10-CM

## 2019-12-03 LAB — POC HEMOCCULT BLD/STL (HOME/3-CARD/SCREEN)
Card #2 Fecal Occult Blod, POC: NEGATIVE
Card #3 Fecal Occult Blood, POC: NEGATIVE
Fecal Occult Blood, POC: NEGATIVE

## 2019-12-04 ENCOUNTER — Other Ambulatory Visit: Payer: Self-pay

## 2019-12-04 DIAGNOSIS — Z0000 Encounter for general adult medical examination without abnormal findings: Secondary | ICD-10-CM

## 2019-12-05 LAB — COMPREHENSIVE METABOLIC PANEL
ALT: 7 IU/L (ref 0–32)
AST: 10 IU/L (ref 0–40)
Albumin/Globulin Ratio: 1.9 (ref 1.2–2.2)
Albumin: 4.5 g/dL (ref 3.8–4.8)
Alkaline Phosphatase: 63 IU/L (ref 44–121)
BUN/Creatinine Ratio: 16 (ref 9–23)
BUN: 11 mg/dL (ref 6–24)
Bilirubin Total: 0.6 mg/dL (ref 0.0–1.2)
CO2: 23 mmol/L (ref 20–29)
Calcium: 9.7 mg/dL (ref 8.7–10.2)
Chloride: 103 mmol/L (ref 96–106)
Creatinine, Ser: 0.7 mg/dL (ref 0.57–1.00)
GFR calc Af Amer: 119 mL/min/{1.73_m2} (ref >59)
GFR calc non Af Amer: 104 mL/min/{1.73_m2} (ref >59)
Globulin, Total: 2.4 g/dL (ref 1.5–4.5)
Glucose: 92 mg/dL (ref 65–99)
Potassium: 4.1 mmol/L (ref 3.5–5.2)
Sodium: 137 mmol/L (ref 134–144)
Total Protein: 6.9 g/dL (ref 6.0–8.5)

## 2019-12-05 LAB — CBC WITH DIFFERENTIAL/PLATELET
Basophils Absolute: 0.1 10*3/uL (ref 0.0–0.2)
Basos: 1 %
EOS (ABSOLUTE): 0.1 10*3/uL (ref 0.0–0.4)
Eos: 3 %
Hematocrit: 41.5 % (ref 34.0–46.6)
Hemoglobin: 14.2 g/dL (ref 11.1–15.9)
Immature Grans (Abs): 0 10*3/uL (ref 0.0–0.1)
Immature Granulocytes: 0 %
Lymphocytes Absolute: 1.7 10*3/uL (ref 0.7–3.1)
Lymphs: 33 %
MCH: 32.3 pg (ref 26.6–33.0)
MCHC: 34.2 g/dL (ref 31.5–35.7)
MCV: 95 fL (ref 79–97)
Monocytes Absolute: 0.5 10*3/uL (ref 0.1–0.9)
Monocytes: 9 %
Neutrophils Absolute: 2.8 10*3/uL (ref 1.4–7.0)
Neutrophils: 54 %
Platelets: 273 10*3/uL (ref 150–450)
RBC: 4.39 x10E6/uL (ref 3.77–5.28)
RDW: 12.3 % (ref 11.7–15.4)
WBC: 5.2 10*3/uL (ref 3.4–10.8)

## 2019-12-05 LAB — LIPID PANEL W/O CHOL/HDL RATIO
Cholesterol, Total: 224 mg/dL — ABNORMAL HIGH (ref 100–199)
HDL: 80 mg/dL (ref >39)
LDL Chol Calc (NIH): 134 mg/dL — ABNORMAL HIGH (ref 0–99)
Triglycerides: 56 mg/dL (ref 0–149)
VLDL Cholesterol Cal: 10 mg/dL (ref 5–40)

## 2020-08-31 IMAGING — MG DIGITAL SCREENING BILATERAL MAMMOGRAM WITH TOMO AND CAD
8 series · 9 of 24 positions shown · non-contrast
Comparison: Previous exam(s).

CLINICAL DATA: Screening.

EXAM:
DIGITAL SCREENING BILATERAL MAMMOGRAM WITH TOMO AND CAD

[R CC synth-2D]
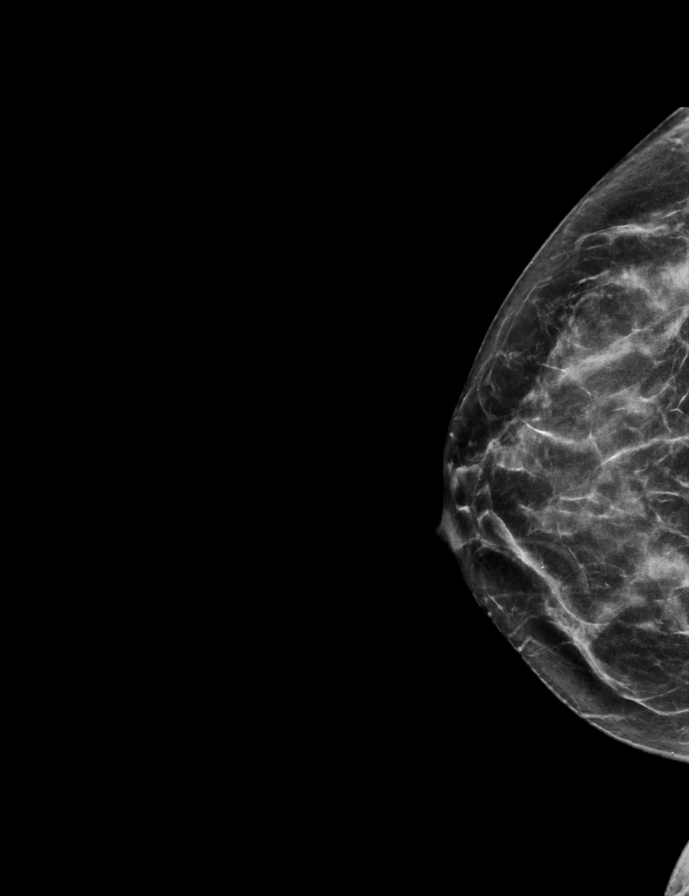

[L MLO synth-2D]
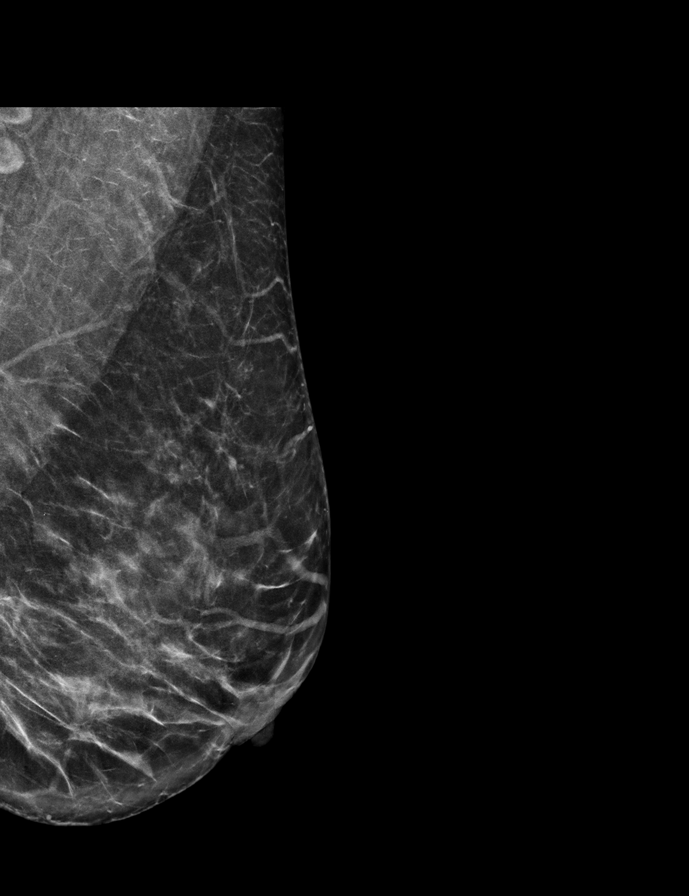

[R MLO synth-2D]
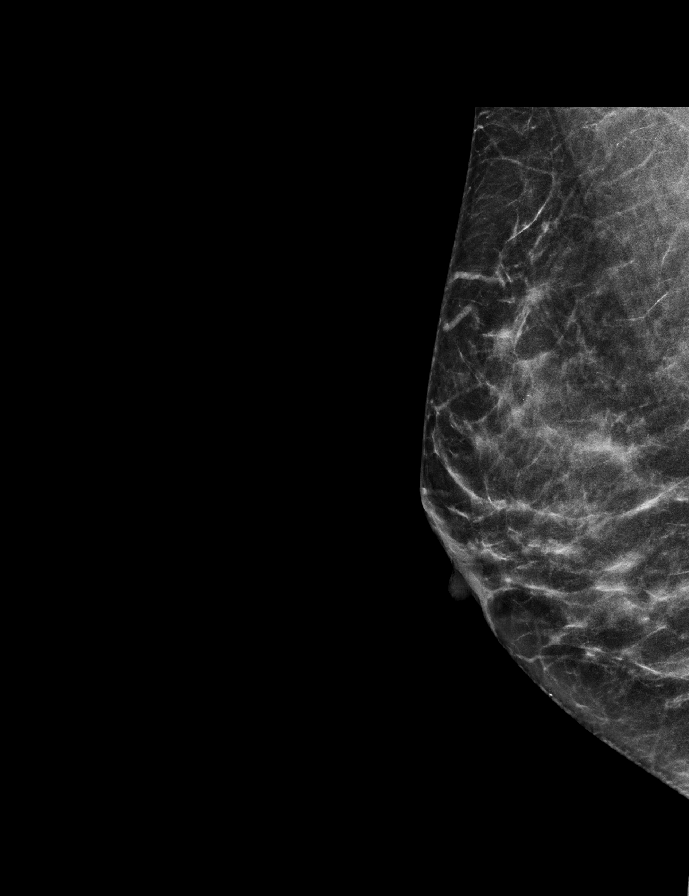

[L CC synth-2D]
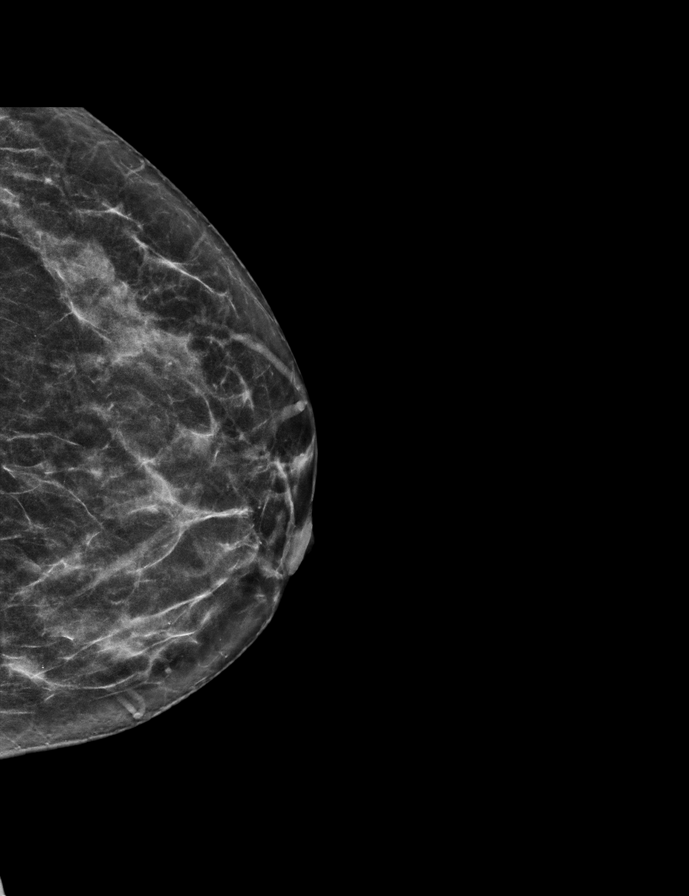

[R MLO tomo · 2 of 54 frames shown]
[frame 18/54]
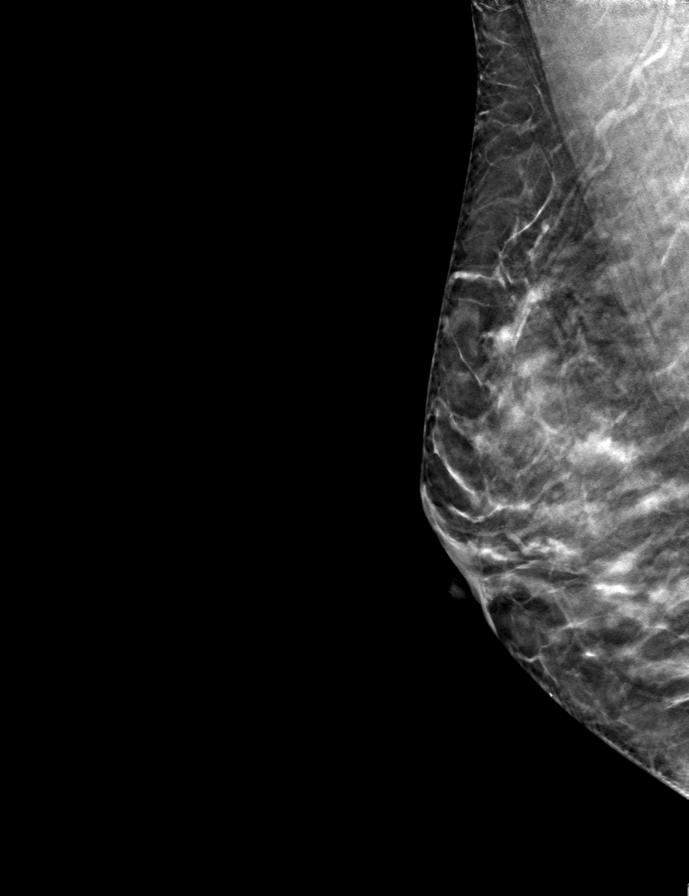
[frame 27/54]
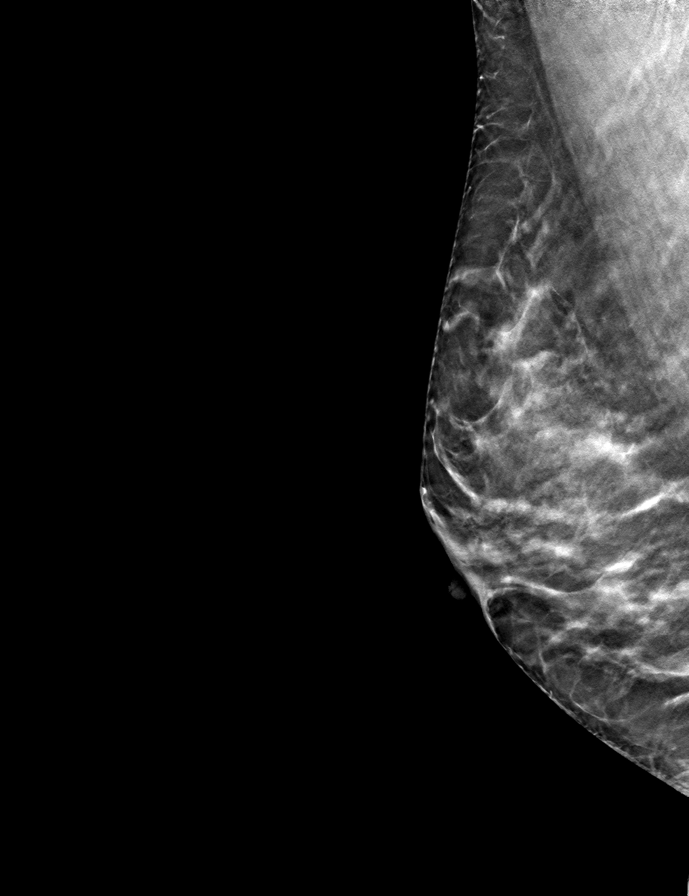

[R CC tomo · tomo slice 30/59.0]
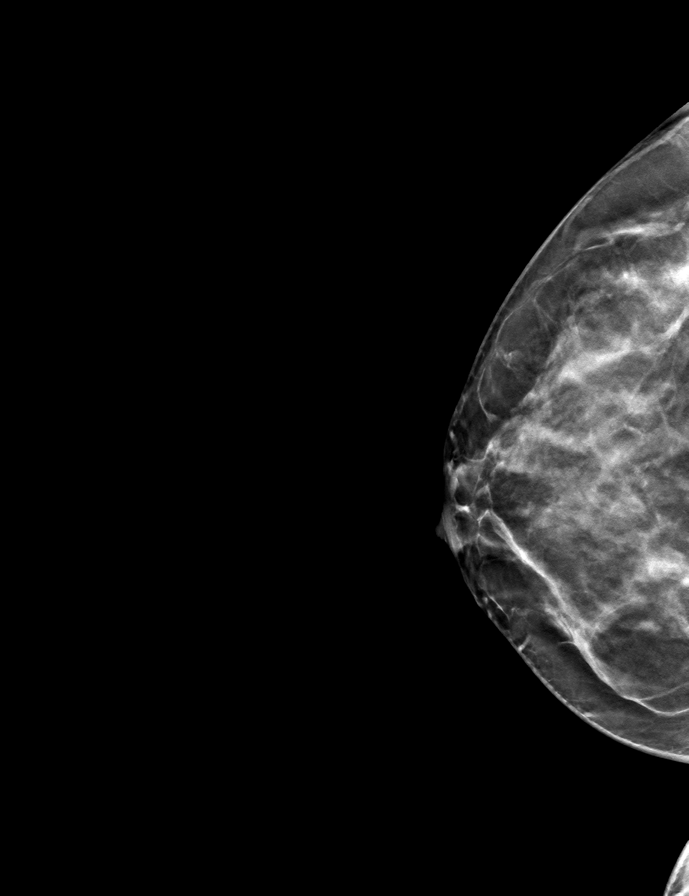

[L CC tomo · tomo slice 27/54.0]
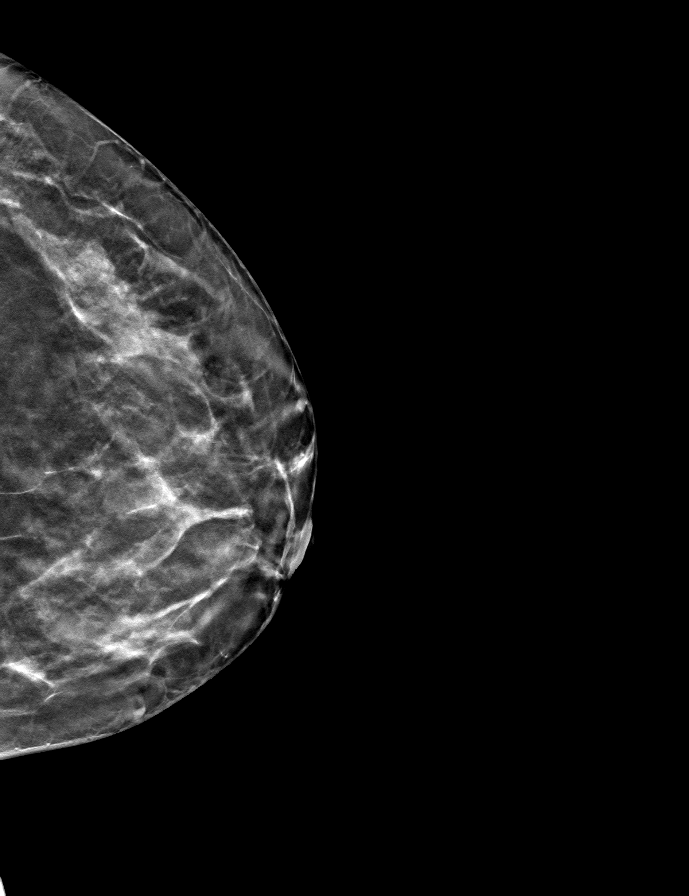

[L MLO tomo · tomo slice 31/60.0]
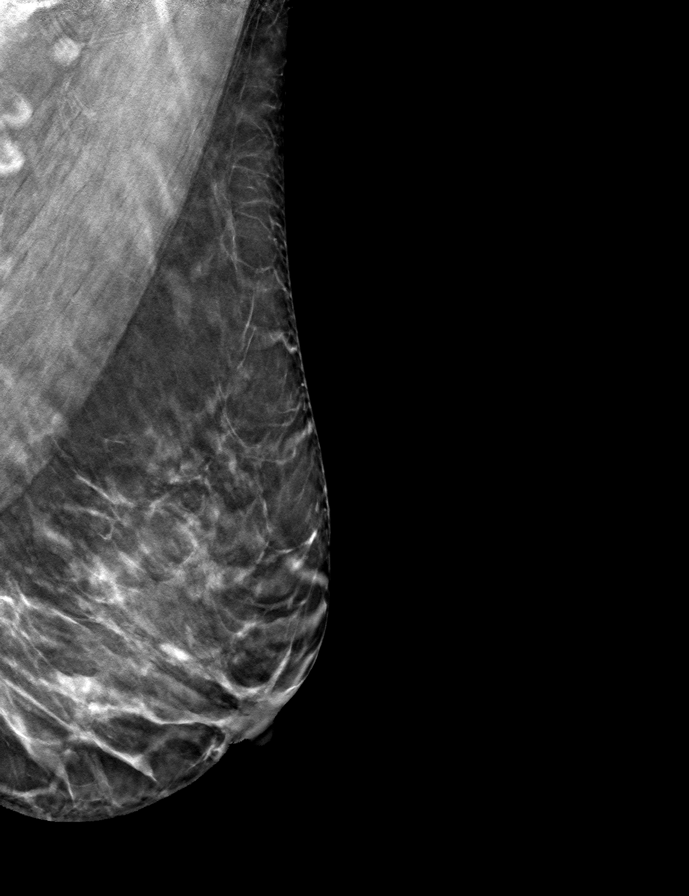

[9 of 24 positions shown; findings below may reference images not displayed]

ACR Breast Density Category c: The breast tissue is heterogeneously
dense, which may obscure small masses.
FINDINGS: There are no findings suspicious for malignancy. Images were
processed with CAD.
IMPRESSION: No mammographic evidence of malignancy. A result letter of this
screening mammogram will be mailed directly to the patient.

RECOMMENDATION:
Screening mammogram in one year. (Code:FT-U-LHB)

BI-RADS CATEGORY  1: Negative.

## 2020-11-24 ENCOUNTER — Encounter: Payer: Self-pay | Admitting: Internal Medicine

## 2021-04-27 ENCOUNTER — Other Ambulatory Visit: Payer: Self-pay

## 2021-04-27 ENCOUNTER — Ambulatory Visit: Payer: Self-pay | Admitting: Internal Medicine

## 2021-04-27 ENCOUNTER — Encounter: Payer: Self-pay | Admitting: Internal Medicine

## 2021-04-27 VITALS — BP 120/80 | HR 72 | Resp 12 | Ht 62.75 in | Wt 126.0 lb

## 2021-04-27 DIAGNOSIS — Z23 Encounter for immunization: Secondary | ICD-10-CM

## 2021-04-27 DIAGNOSIS — Z1159 Encounter for screening for other viral diseases: Secondary | ICD-10-CM

## 2021-04-27 DIAGNOSIS — E7800 Pure hypercholesterolemia, unspecified: Secondary | ICD-10-CM

## 2021-04-27 DIAGNOSIS — Z0000 Encounter for general adult medical examination without abnormal findings: Secondary | ICD-10-CM

## 2021-04-27 NOTE — Progress Notes (Unsigned)
? ? ?  Subjective:  ?  ?Patient ID: Suzanne Rivas, female   DOB: 1970-09-23, 51 y.o.   MRN: [id number] ? ? ?HPI ? ?CPE without pap ? ?1.  Pap:  Last was 11/2019 and normal.   ? ?2.  Mammogram:  Last was 01/2018 and normal.  No family history of breast cancer.   ? ?3.  Osteoprevention:  Still having periods.  Does drink Lactaid milk.  She is willing to drink a cup with each meal.  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   ? ?4.  Guaiac Cards/FIT:  Last performed 11/2019 and negative for blood. ? ?5.  Colonoscopy:  Never.  No family history of colon cancer.   ? ?6.  Immunizations:  Had one COVID booster ? ?7.  Glucose/Cholesterol:  Glucose has always been fine.  Cholesterol a bit high, but very high HDL.   ?No outpatient medications have been marked as taking for the 04/27/21 encounter (Office Visit) with Mack Hook, MD.  ? ?Allergies  ?Allergen Reactions  ? Latex Itching and Rash  ? ? ? ?Review of Systems  ?HENT:  Negative for dental problem (Goes [facility] dental clinic near [facility]   ?Eyes:  Negative for visual disturbance (Reading glasses with computer work.).  ?Respiratory:  Negative for shortness of breath.   ?Cardiovascular:  Positive for palpitations (Rare with stress.). Negative for chest pain and leg swelling.  ?Genitourinary:  Positive for menstrual problem (recent month with 2 periods for first time.). Negative for vaginal discharge.  ?Musculoskeletal:   ?     When awakens in morning and stretches her left arm--gets muscle spasm in shoulder and upper back.    ?Neurological:  Negative for weakness and numbness.  ?Psychiatric/Behavioral:  Negative for dysphoric mood (rare). Decreased concentration: Rare.The patient is not nervous/anxious (rare).   ? ? ? ?Objective:  ? ?BP 120/80 (BP Location: Left Arm, Patient Position: Sitting, Cuff Size: Normal)   Pulse 72   Resp 12   Ht 5' 2.75" (1.594 m)   Wt 126 lb (57.2 kg)   LMP 04/20/2021 (Exact Date)    BMI 22.50 kg/m?  ? ?Physical Exam ?Abdominal:  ?   General: Bowel sounds are normal.  ?   Palpations: Abdomen is soft. There is mass (oblong feces shaped mass in RLQ that resolves with palpation, but tender in RLQ as well.  Not able to palpate after initial pressure.).  ? ? ? ?Assessment & Plan  ? ?*** ? ?

## 2021-04-28 LAB — CBC WITH DIFFERENTIAL/PLATELET
Basophils Absolute: 0.1 10*3/uL (ref 0.0–0.2)
Basos: 1 %
EOS (ABSOLUTE): 0.1 10*3/uL (ref 0.0–0.4)
Eos: 2 %
Hematocrit: 37.4 % (ref 34.0–46.6)
Hemoglobin: 13.2 g/dL (ref 11.1–15.9)
Immature Grans (Abs): 0 10*3/uL (ref 0.0–0.1)
Immature Granulocytes: 0 %
Lymphocytes Absolute: 1.6 10*3/uL (ref 0.7–3.1)
Lymphs: 26 %
MCH: 33.2 pg — ABNORMAL HIGH (ref 26.6–33.0)
MCHC: 35.3 g/dL (ref 31.5–35.7)
MCV: 94 fL (ref 79–97)
Monocytes Absolute: 0.4 10*3/uL (ref 0.1–0.9)
Monocytes: 7 %
Neutrophils Absolute: 3.8 10*3/uL (ref 1.4–7.0)
Neutrophils: 64 %
Platelets: 296 10*3/uL (ref 150–450)
RBC: 3.98 x10E6/uL (ref 3.77–5.28)
RDW: 12 % (ref 11.7–15.4)
WBC: 5.9 10*3/uL (ref 3.4–10.8)

## 2021-04-28 LAB — COMPREHENSIVE METABOLIC PANEL
ALT: 11 IU/L (ref 0–32)
AST: 14 IU/L (ref 0–40)
Albumin/Globulin Ratio: 2.1 (ref 1.2–2.2)
Albumin: 4.7 g/dL (ref 3.8–4.8)
Alkaline Phosphatase: 71 IU/L (ref 44–121)
BUN/Creatinine Ratio: 18 (ref 9–23)
BUN: 14 mg/dL (ref 6–24)
Bilirubin Total: <0.2 mg/dL (ref 0.0–1.2)
CO2: 21 mmol/L (ref 20–29)
Calcium: 9.8 mg/dL (ref 8.7–10.2)
Chloride: 102 mmol/L (ref 96–106)
Creatinine, Ser: 0.8 mg/dL (ref 0.57–1.00)
Globulin, Total: 2.2 g/dL (ref 1.5–4.5)
Glucose: 95 mg/dL (ref 70–99)
Potassium: 3.8 mmol/L (ref 3.5–5.2)
Sodium: 142 mmol/L (ref 134–144)
Total Protein: 6.9 g/dL (ref 6.0–8.5)
eGFR: 91 mL/min/{1.73_m2} (ref >59)

## 2021-04-28 LAB — LIPID PANEL W/O CHOL/HDL RATIO
Cholesterol, Total: 222 mg/dL — ABNORMAL HIGH (ref 100–199)
HDL: 74 mg/dL (ref >39)
LDL Chol Calc (NIH): 127 mg/dL — ABNORMAL HIGH (ref 0–99)
Triglycerides: 121 mg/dL (ref 0–149)
VLDL Cholesterol Cal: 21 mg/dL (ref 5–40)

## 2021-04-28 LAB — HEPATITIS C ANTIBODY: Hep C Virus Ab: NONREACTIVE

## 2022-08-12 ENCOUNTER — Ambulatory Visit: Payer: BLUE CROSS/BLUE SHIELD | Admitting: Family Medicine
